# Patient Record
Sex: Male | Born: 1935 | Race: Black or African American | Hispanic: No | Smoking: Former smoker
Health system: Southern US, Community
[De-identification: ages and names within clinical notes are randomized; demographics above are authoritative.]

## PROBLEM LIST (undated history)

## (undated) DIAGNOSIS — H269 Unspecified cataract: Secondary | ICD-10-CM

## (undated) DIAGNOSIS — M1990 Unspecified osteoarthritis, unspecified site: Secondary | ICD-10-CM

## (undated) DIAGNOSIS — E785 Hyperlipidemia, unspecified: Secondary | ICD-10-CM

## (undated) DIAGNOSIS — E119 Type 2 diabetes mellitus without complications: Secondary | ICD-10-CM

## (undated) DIAGNOSIS — K219 Gastro-esophageal reflux disease without esophagitis: Secondary | ICD-10-CM

## (undated) DIAGNOSIS — G4733 Obstructive sleep apnea (adult) (pediatric): Secondary | ICD-10-CM

## (undated) DIAGNOSIS — G459 Transient cerebral ischemic attack, unspecified: Secondary | ICD-10-CM

## (undated) DIAGNOSIS — I4892 Unspecified atrial flutter: Secondary | ICD-10-CM

## (undated) DIAGNOSIS — J449 Chronic obstructive pulmonary disease, unspecified: Secondary | ICD-10-CM

## (undated) DIAGNOSIS — R972 Elevated prostate specific antigen [PSA]: Secondary | ICD-10-CM

## (undated) DIAGNOSIS — F419 Anxiety disorder, unspecified: Secondary | ICD-10-CM

## (undated) DIAGNOSIS — I10 Essential (primary) hypertension: Secondary | ICD-10-CM

## (undated) HISTORY — PX: PROSTATE BIOPSY: SHX241

## (undated) HISTORY — PX: KNEE ARTHROPLASTY: SHX992

---

## 2014-02-04 ENCOUNTER — Emergency Department (HOSPITAL_COMMUNITY)
Admission: EM | Admit: 2014-02-04 | Discharge: 2014-02-04 | Disposition: A | Discharge status: Home / Self Care | Payer: Medicare Other | Attending: Emergency Medicine | Admitting: Emergency Medicine

## 2014-02-04 ENCOUNTER — Encounter (HOSPITAL_COMMUNITY): Payer: Self-pay | Admitting: Emergency Medicine

## 2014-02-04 DIAGNOSIS — Z87891 Personal history of nicotine dependence: Secondary | ICD-10-CM | POA: Diagnosis not present

## 2014-02-04 DIAGNOSIS — F419 Anxiety disorder, unspecified: Secondary | ICD-10-CM | POA: Diagnosis not present

## 2014-02-04 DIAGNOSIS — Z79899 Other long term (current) drug therapy: Secondary | ICD-10-CM | POA: Insufficient documentation

## 2014-02-04 HISTORY — DX: Anxiety disorder, unspecified: F41.9

## 2014-02-04 MED ORDER — CLONAZEPAM 1 MG PO TABS: 0.5000 mg | ORAL_TABLET | Freq: Two times a day (BID) | ORAL | Status: AC

## 2014-02-04 NOTE — ED Notes (Addendum)
Pt reports hx of anxiety, was taking clonazepam but they stopped giving him prescription for it. Pt stopped taking it around July and having increase in anxiety. Was then prescribed buspirone but reports it doesn't help. Feels irritable and anxious about things, such as "his shoe laces being too tight." no acute distress noted at triage.

## 2014-02-04 NOTE — Discharge Instructions (Signed)

## 2014-02-04 NOTE — ED Provider Notes (Signed)
CSN: [id number]     Arrival date & time 02/04/14  1746 History   First MD Initiated Contact with Patient 02/04/14 1932     Chief Complaint  Patient presents with  . Anxiety     (Consider location/radiation/quality/duration/timing/severity/associated sxs/prior Treatment) HPI  Willie Harris is a(n) 78 y.o. male who presents to the ED with cc of anxiety. He has been out of his clonazepam for the past 2 weeks. He has been having frequent anxiety attacks. He was taken off  Klonopin because he tested positive for marijuana. The patient c/o irritability, anxiety. He denies any cp, sob, tremulousness, or seizure activity. He has been off of his meds for 2 weeks.  Past Medical History  Diagnosis Date  . Anxiety    History reviewed. No pertinent past surgical history. History reviewed. No pertinent family history. History  Substance Use Topics  . Smoking status: Former Games developermoker  . Smokeless tobacco: Not on file  . Alcohol Use: Yes     Comment: occ    Review of Systems  Ten systems reviewed and are negative for acute change, except as noted in the HPI.    Allergies  Sulfa antibiotics  Home Medications   Prior to Admission medications   Medication Sig Start Date End Date Taking? Authorizing Provider  hydrOXYzine (ATARAX/VISTARIL) 10 MG tablet Take 10 mg by mouth at bedtime as needed for anxiety (for sleep).   Yes Historical Provider, MD  clonazePAM (KLONOPIN) 0.5 MG tablet Take 0.5 mg by mouth 2 (two) times daily.    Historical Provider, MD   BP 160/90  Pulse 60  Temp(Src) 97.7 F (36.5 C) (Oral)  Resp 18  SpO2 96% Physical Exam Physical Exam  Nursing note and vitals reviewed. Constitutional: He appears well-developed and well-nourished. No distress.  HENT:  Head: Normocephalic and atraumatic.  Eyes: Conjunctivae normal are normal. No scleral icterus.  Neck: Normal range of motion. Neck supple.  Cardiovascular: Normal rate, regular rhythm and normal heart sounds.    Pulmonary/Chest: Effort normal and breath sounds normal. No respiratory distress.  Abdominal: Soft. There is no tenderness.  Musculoskeletal: He exhibits no edema.  Neurological: He is alert.  Skin: Skin is warm and dry. He is not diaphoretic.  Psychiatric: His behavior is normal.    ED Course  Procedures (including critical care time) Labs Review Labs Reviewed - No data to display  Imaging Review No results found.   EKG Interpretation None      MDM   Final diagnoses:  Anxiety    Pleasant make in NAD. I discussed ED policy with the patient regarding long term management of medication. I will give the patient a fes days worth of medication.  Patient is to follow up wit his prescribing physician.    Arthor CaptainAbigail Lashanna Angelo, PA-C 02/07/14 [phone]

## 2014-02-06 ENCOUNTER — Encounter (HOSPITAL_COMMUNITY): Payer: Self-pay | Admitting: Emergency Medicine

## 2014-02-06 ENCOUNTER — Emergency Department (HOSPITAL_COMMUNITY): Payer: Medicare Other

## 2014-02-06 ENCOUNTER — Emergency Department (HOSPITAL_COMMUNITY)
Admission: EM | Admit: 2014-02-06 | Discharge: 2014-02-06 | Disposition: A | Discharge status: Home / Self Care | Payer: Medicare Other | Attending: Emergency Medicine | Admitting: Emergency Medicine

## 2014-02-06 DIAGNOSIS — Z87891 Personal history of nicotine dependence: Secondary | ICD-10-CM | POA: Insufficient documentation

## 2014-02-06 DIAGNOSIS — F419 Anxiety disorder, unspecified: Secondary | ICD-10-CM | POA: Insufficient documentation

## 2014-02-06 DIAGNOSIS — Z79899 Other long term (current) drug therapy: Secondary | ICD-10-CM | POA: Insufficient documentation

## 2014-02-06 DIAGNOSIS — H539 Unspecified visual disturbance: Secondary | ICD-10-CM | POA: Diagnosis not present

## 2014-02-06 DIAGNOSIS — R269 Unspecified abnormalities of gait and mobility: Secondary | ICD-10-CM | POA: Diagnosis not present

## 2014-02-06 HISTORY — DX: Unspecified cataract: H26.9

## 2014-02-06 LAB — CBC WITH DIFFERENTIAL/PLATELET
BASOS ABS: 0 10*3/uL (ref 0.0–0.1)
Basophils Relative: 0 % (ref 0–1)
EOS PCT: 3 % (ref 0–5)
Eosinophils Absolute: 0.2 10*3/uL (ref 0.0–0.7)
HEMATOCRIT: 40.9 % (ref 39.0–52.0)
Hemoglobin: 14.2 g/dL (ref 13.0–17.0)
LYMPHS PCT: 36 % (ref 12–46)
Lymphs Abs: 2.3 10*3/uL (ref 0.7–4.0)
MCH: 30.1 pg (ref 26.0–34.0)
MCHC: 34.7 g/dL (ref 30.0–36.0)
MCV: 86.7 fL (ref 78.0–100.0)
MONO ABS: 0.6 10*3/uL (ref 0.1–1.0)
MONOS PCT: 9 % (ref 3–12)
Neutro Abs: 3.4 10*3/uL (ref 1.7–7.7)
Neutrophils Relative %: 52 % (ref 43–77)
Platelets: 188 10*3/uL (ref 150–400)
RBC: 4.72 MIL/uL (ref 4.22–5.81)
RDW: 13.7 % (ref 11.5–15.5)
WBC: 6.5 10*3/uL (ref 4.0–10.5)

## 2014-02-06 LAB — COMPREHENSIVE METABOLIC PANEL
ALT: 18 U/L (ref 0–53)
ANION GAP: 10 (ref 5–15)
AST: 19 U/L (ref 0–37)
Albumin: 3.4 g/dL — ABNORMAL LOW (ref 3.5–5.2)
Alkaline Phosphatase: 52 U/L (ref 39–117)
BUN: 13 mg/dL (ref 6–23)
CALCIUM: 9.2 mg/dL (ref 8.4–10.5)
CO2: 25 meq/L (ref 19–32)
CREATININE: 0.99 mg/dL (ref 0.50–1.35)
Chloride: 105 mEq/L (ref 96–112)
GFR calc Af Amer: >90 mL/min (ref >90)
GFR, EST NON AFRICAN AMERICAN: 78 mL/min — AB (ref >90)
Glucose, Bld: 104 mg/dL — ABNORMAL HIGH (ref 70–99)
Potassium: 4.5 mEq/L (ref 3.7–5.3)
Sodium: 140 mEq/L (ref 137–147)
Total Bilirubin: 0.7 mg/dL (ref 0.3–1.2)
Total Protein: 7 g/dL (ref 6.0–8.3)

## 2014-02-06 LAB — ETHANOL

## 2014-02-06 NOTE — ED Notes (Signed)
Neuro MD at bedside

## 2014-02-06 NOTE — Discharge Instructions (Signed)
1. See your PCP in 1 week 2. Follow up with neurology referral in 2 weeks 3. Come back if worsening symptoms Fall Prevention and Home Safety Falls cause injuries and can affect all age groups. It is possible to use preventive measures to significantly decrease the likelihood of falls. There are many simple measures which can make your home safer and prevent falls. OUTDOORS  Repair cracks and edges of walkways and driveways.  Remove high doorway thresholds.  Trim shrubbery on the main path into your home.  Have good outside lighting.  Clear walkways of tools, rocks, debris, and clutter.  Check that handrails are not broken and are securely fastened. Both sides of steps should have handrails.  Have leaves, snow, and ice cleared regularly.  Use sand or salt on walkways during winter months.  In the garage, clean up grease or oil spills. BATHROOM  Install night lights.  Install grab bars by the toilet and in the tub and shower.  Use non-skid mats or decals in the tub or shower.  Place a plastic non-slip stool in the shower to sit on, if needed.  Keep floors dry and clean up all water on the floor immediately.  Remove soap buildup in the tub or shower on a regular basis.  Secure bath mats with non-slip, double-sided rug tape.  Remove throw rugs and tripping hazards from the floors. BEDROOMS  Install night lights.  Make sure a bedside light is easy to reach.  Do not use oversized bedding.  Keep a telephone by your bedside.  Have a firm chair with side arms to use for getting dressed.  Remove throw rugs and tripping hazards from the floor. KITCHEN  Keep handles on pots and pans turned toward the center of the stove. Use back burners when possible.  Clean up spills quickly and allow time for drying.  Avoid walking on wet floors.  Avoid hot utensils and knives.  Position shelves so they are not too high or low.  Place commonly used objects within easy  reach.  If necessary, use a sturdy step stool with a grab bar when reaching.  Keep electrical cables out of the way.  Do not use floor polish or wax that makes floors slippery. If you must use wax, use non-skid floor wax.  Remove throw rugs and tripping hazards from the floor. STAIRWAYS  Never leave objects on stairs.  Place handrails on both sides of stairways and use them. Fix any loose handrails. Make sure handrails on both sides of the stairways are as long as the stairs.  Check carpeting to make sure it is firmly attached along stairs. Make repairs to worn or loose carpet promptly.  Avoid placing throw rugs at the top or bottom of stairways, or properly secure the rug with carpet tape to prevent slippage. Get rid of throw rugs, if possible.  Have an electrician put in a light switch at the top and bottom of the stairs. OTHER FALL PREVENTION TIPS  Wear low-heel or rubber-soled shoes that are supportive and fit well. Wear closed toe shoes.  When using a stepladder, make sure it is fully opened and both spreaders are firmly locked. Do not climb a closed stepladder.  Add color or contrast paint or tape to grab bars and handrails in your home. Place contrasting color strips on first and last steps.  Learn and use mobility aids as needed. Install an electrical emergency response system.  Turn on lights to avoid dark areas. Replace light bulbs  that burn out immediately. Get light switches that glow.  Arrange furniture to create clear pathways. Keep furniture in the same place.  Firmly attach carpet with non-skid or double-sided tape.  Eliminate uneven floor surfaces.  Select a carpet pattern that does not visually hide the edge of steps.  Be aware of all pets. OTHER HOME SAFETY TIPS  Set the water temperature for 120 F (48.8 C).  Keep emergency numbers on or near the telephone.  Keep smoke detectors on every level of the home and near sleeping areas. Document Released:  03/24/2002 Document Revised: 10/03/2011 Document Reviewed: 06/23/2011 [facility] Patient Information 2015 [place]. This information is not intended to replace advice given to you by your health care provider. Make sure you discuss any questions you have with your health care provider.

## 2014-02-06 NOTE — ED Provider Notes (Signed)
CSN: [id number]     Arrival date & time 02/06/14  1107 History   First MD Initiated Contact with Patient 02/06/14 1114     Chief Complaint  Patient presents with  . Gait Problem     (Consider location/radiation/quality/duration/timing/severity/associated sxs/prior Treatment) Patient is a 78 y.o. male presenting with near-syncope. The history is provided by the patient. No language interpreter was used.  Near Syncope This is a new problem. The current episode started today. The problem occurs constantly. The problem has been gradually improving. Pertinent negatives include no abdominal pain, chest pain, congestion, coughing, fever, headaches, nausea, numbness, rash, sore throat or vomiting. The symptoms are aggravated by walking. He has tried nothing for the symptoms. The treatment provided no relief.    Past Medical History  Diagnosis Date  . Anxiety    No past surgical history on file. No family history on file. History  Substance Use Topics  . Smoking status: Former Games developer  . Smokeless tobacco: Not on file  . Alcohol Use: Yes     Comment: occ    Review of Systems  Constitutional: Negative for fever.  HENT: Negative for congestion, rhinorrhea and sore throat.   Eyes: Positive for visual disturbance.  Respiratory: Negative for cough and shortness of breath.   Cardiovascular: Positive for near-syncope. Negative for chest pain.  Gastrointestinal: Negative for nausea, vomiting, abdominal pain and diarrhea.  Genitourinary: Negative for dysuria and hematuria.  Musculoskeletal: Positive for gait problem.  Skin: Negative for rash.  Neurological: Positive for light-headedness. Negative for syncope, numbness and headaches.  All other systems reviewed and are negative.     Allergies  Sulfa antibiotics  Home Medications   Prior to Admission medications   Medication Sig Start Date End Date Taking? Authorizing Provider  clonazePAM (KLONOPIN) 1 MG tablet Take 0.5 tablets (0.5  mg total) by mouth 2 (two) times daily. 02/04/14   Arthor Captain, PA-C  hydrOXYzine (ATARAX/VISTARIL) 10 MG tablet Take 10 mg by mouth at bedtime as needed for anxiety (for sleep).    Historical Provider, MD   BP 128/99  Temp(Src) 97.3 F (36.3 C) (Oral)  Resp 18  SpO2 98% Physical Exam  Nursing note and vitals reviewed. Constitutional: He is oriented to person, place, and time. He appears well-developed and well-nourished.  HENT:  Head: Normocephalic and atraumatic.  Right Ear: External ear normal.  Left Ear: External ear normal.  Eyes: EOM are normal.  Neck: Normal range of motion. Neck supple.  Cardiovascular: Normal rate, regular rhythm and intact distal pulses.  Exam reveals no gallop and no friction rub.   No murmur heard. Pulmonary/Chest: Effort normal and breath sounds normal. No respiratory distress. He has no wheezes. He has no rales. He exhibits no tenderness.  Abdominal: Soft. Bowel sounds are normal. He exhibits no distension. There is no tenderness. There is no rebound.  Musculoskeletal: Normal range of motion. He exhibits no edema and no tenderness.  Lymphadenopathy:    He has no cervical adenopathy.  Neurological: He is alert and oriented to person, place, and time. No cranial nerve deficit. He exhibits normal muscle tone. Coordination normal.  Neurologic exam: CN I-XII: grossly intact, Sensation: normal in upper and lower extremities, Strength 5/5 in both upper and lower extremities, Coordination intact. Gait: antalgic  Skin: Skin is warm. No rash noted.  Psychiatric: He has a normal mood and affect. His behavior is normal.    ED Course  Procedures (including critical care time) Labs Review Labs Reviewed  COMPREHENSIVE METABOLIC  PANEL - Abnormal; Notable for the following:    Glucose, Bld 104 (*)    Albumin 3.4 (*)    GFR calc non Af Amer 78 (*)    All other components within normal limits  CBC WITH DIFFERENTIAL  ETHANOL  URINE RAPID DRUG SCREEN (HOSP  PERFORMED)  URINALYSIS, ROUTINE W REFLEX MICROSCOPIC    Imaging Review Mr Brain Wo Contrast  02/06/2014   CLINICAL DATA:  Gait abnormality.  Dizziness and blurred vision  EXAM: MRI HEAD WITHOUT CONTRAST  TECHNIQUE: Multiplanar, multiecho pulse sequences of the brain and surrounding structures were obtained without intravenous contrast.  COMPARISON:  None.  FINDINGS: Negative for acute infarct.  Mild atrophy. Mild chronic microvascular ischemic change in the white matter and pons. 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 No cortical infarct  Negative for intracranial hemorrhage. No mass or edema. No shift of the midline structures.  Mild mucosal edema paranasal sinuses. Vessels at the base of the brain are patent.  IMPRESSION: Negative for acute infarct  Mild atrophy and chronic microvascular ischemic change.   Electronically Signed   By: Marlan Palauharles  Clark M.D.   On: 02/06/2014 13:28     EKG Interpretation None      MDM   Final diagnoses:  None    11:15 AM Pt is a 78 y.o. male with pertinent PMHX of anxiety, HTN, TIA 4 years ago who presents to the ED with gait disturbance, lightheadedness, blurry vision and elevated BP. Awoke this morning around 9AM with blurry vision, gait disturbance. No loss of hearing, endorses lightheadedness. [facility]ed [facility]: BP was 150/80. Per pharmacy and EMS issues with visual fields. No chest pain or shortness of breath. No hearing or vision loss. No recent illness. No urinary symptoms. Denies any cocaine or alcohol.   On exam: well appearing, non focal neurologic exam with intact coordination. Upon walking patient, patient with abnormal gait. Shuffling right and left. Attempted to narrow gait with worsening gait disturbance. Concern for possible stroke versus posterior stroke given history of TIA. Plan for MRI brain wo contrast after discussion with neurology (does not meet window for TPA or thrombectomy): CBc, CMP, ETOH,  EKG  personally reviewed by myself showed NSR Rate of 50, PR 171ms, QRS 93ms QT/QTC 447/44808ms, normal axis, without evidence of new ischemia. No Comparison, indication: gait disturbance  Review of labs: CBc: no leukocytosis, H&H 14.2/40.9 CMP: no electrolyte abnormalities, no elevated LFTs ETOH: <11  MRI brain wo contrast no evidence of stroke. Reviewed with neurologist. Patient without evidence of stroke. Plan for discharge with close follow up with neurology outpatient and PCP as needed. Patient given strict return precautions  2:30 PM:  I have discussed the diagnosis/risks/treatment options with the patient and believe the pt to be eligible for discharge home to follow-up with PCP and neurology referral. We also discussed returning to the ED immediately if new or worsening sx occur. We discussed the sx which are most concerning (e.g., worsening symptoms) that necessitate immediate return. Any new prescriptions provided to the patient are listed below.   New Prescriptions   No medications on file    The patient appears reasonably screened and/or stabilized for discharge and I doubt any other medical condition or other [facility] requiring further screening, evaluation or treatment in the ED at this time prior to discharge . Pt in agreement with discharge plan. Return precautions given. Pt discharged VSS   Labs, EKG and imaging reviewed by myself and considered in  medical decision making if ordered.  Imaging interpreted by radiology. Pt was discussed with my attending, Dr. Doristine MangoBeaton     Kimoni Pickerill Peter Navarro Nine, MD 02/06/14 [phone]

## 2014-02-06 NOTE — ED Notes (Addendum)
Pt reports waking up at 9 and feeling unsteady as he stood up. Reports dizziness and blurred vision while walking. No vision loss or falls reported. Pt denies NV or headache. Pt reports no changes in vision or gait since initial recognition this morning. Pt also reports a Hx of dizziness when standing in the mornings x1 month.

## 2014-02-06 NOTE — Consult Note (Signed)
Referring Physician: ED    Chief Complaint: ataxia, dizziness, blurred vision  HPI:                                                                                                                                         Willie Harris is an 78 y.o. male with a past medical history significant for anxiety, panic attacks, TIA few years ago, comes in for further evaluation of the above stated symptoms. Wife is at the bedside. Patient stated that he usually gets dizzy and off balance in the morning, but when he woke up this morning around 9 am " the imbalance and dizziness was worse than ever". Did not fall and had associated lightheadedness and blurred vision. No associated HA, double vision, focal weakness or numbness, slurred speech, or language impairment. NIHSS 0 in the ED. MRI brain showed no acute intracranial abnormality or other lesions to explain his symptoms.  ETOH<11. UDS pending. Date last known well: 02/05/14 Time last known well: uncertain tPA Given: no, late presentation, symptoms virtually resolved. NIHSS: 0   Past Medical History  Diagnosis Date  . Anxiety   . Cataract     bilateral    History reviewed. No pertinent past surgical history.  History reviewed. No pertinent family history. Social History:  reports that he quit smoking about 8 months ago. He does not have any smokeless tobacco history on file. He reports that he drinks alcohol. He reports that he uses illicit drugs (Marijuana).  Allergies:  Allergies  Allergen Reactions  . Sulfa Antibiotics Other (See Comments)    Causing skin on hand to get real tight    Medications:                                                                                                                           I have reviewed the patient's current medications.  ROS:  History obtained from the  patient and chart review  General ROS: negative for - chills, fatigue, fever, night sweats, weight gain or weight loss Psychological ROS: negative for - behavioral disorder, hallucinations, mood swings or suicidal ideation Significant for some forgetfulness.  Ophthalmic ROS: negative for - blurry vision, double vision, eye pain or loss of vision ENT ROS: negative for - epistaxis, nasal discharge, oral lesions, sore throat, tinnitus or vertigo Allergy and Immunology ROS: negative for - hives or itchy/watery eyes Hematological and Lymphatic ROS: negative for - bleeding problems, bruising or swollen lymph nodes Endocrine ROS: negative for - galactorrhea, hair pattern changes, polydipsia/polyuria or temperature intolerance Respiratory ROS: negative for - cough, hemoptysis, shortness of breath or wheezing Cardiovascular ROS: negative for - chest pain, dyspnea on exertion, edema or irregular heartbeat Gastrointestinal ROS: negative for - abdominal pain, diarrhea, hematemesis, nausea/vomiting or stool incontinence Genito-Urinary ROS: negative for - dysuria, hematuria, incontinence or urinary frequency/urgency Musculoskeletal ROS: negative for - joint swelling or muscular weakness Neurological ROS: as noted in HPI Dermatological ROS: negative for rash and skin lesion changes  Physical exam: pleasant male in no apparent distress. Blood pressure 151/114, pulse 47, temperature 97.3 F (36.3 C), temperature source Oral, resp. rate 17, height 5' 11"  (1.803 m), weight 81.647 kg (180 lb), SpO2 92.00%. Head: normocephalic. Neck: supple, no bruits, no JVD. Cardiac: no murmurs. Lungs: clear. Abdomen: soft, no tender, no mass. Extremities: no edema. Neurologic Examination:                                                                                                      General: Mental Status: Alert, oriented, thought content appropriate.  Speech fluent without evidence of aphasia.  Able to follow 3 step  commands without difficulty. Cranial Nerves: II: Discs flat bilaterally; Visual fields grossly normal, pupils equal, round, reactive to light and accommodation III,IV, VI: ptosis not present, extra-ocular motions intact bilaterally V,VII: smile symmetric, facial light touch sensation normal bilaterally VIII: hearing normal bilaterally IX,X: gag reflex present XI: bilateral shoulder shrug XII: midline tongue extension without atrophy or fasciculations Motor: Right : Upper extremity   5/5    Left:     Upper extremity   5/5  Lower extremity   5/5     Lower extremity   5/5 Tone and bulk:normal tone throughout; no atrophy noted Sensory: Pinprick and light touch intact throughout, bilaterally Deep Tendon Reflexes:  Right: Upper Extremity   Left: Upper extremity   biceps (C-5 to C-6) 2/4   biceps (C-5 to C-6) 2/4 tricep (C7) 2/4    triceps (C7) 2/4 Brachioradialis (C6) 2/4  Brachioradialis (C6) 2/4  Lower Extremity Lower Extremity  quadriceps (L-2 to L-4) 2/4   quadriceps (L-2 to L-4) 2/4 Achilles (S1) 2/4   Achilles (S1) 2/4  Plantars: Right: downgoing   Left: downgoing Cerebellar: normal finger-to-nose,  normal heel-to-shin test Gait:  No ataxia.    Results for orders placed during the hospital encounter of 02/06/14 (from the past 48 hour(s))  CBC WITH DIFFERENTIAL     Status: None   Collection Time  02/06/14 12:04 PM      Result Value Ref Range   WBC 6.5  4.0 - 10.5 K/uL   RBC 4.72  4.22 - 5.81 MIL/uL   Hemoglobin 14.2  13.0 - 17.0 g/dL   HCT 40.9  39.0 - 52.0 %   MCV 86.7  78.0 - 100.0 fL   MCH 30.1  26.0 - 34.0 pg   MCHC 34.7  30.0 - 36.0 g/dL   RDW 13.7  11.5 - 15.5 %   Platelets 188  150 - 400 K/uL   Neutrophils Relative % 52  43 - 77 %   Neutro Abs 3.4  1.7 - 7.7 K/uL   Lymphocytes Relative 36  12 - 46 %   Lymphs Abs 2.3  0.7 - 4.0 K/uL   Monocytes Relative 9  3 - 12 %   Monocytes Absolute 0.6  0.1 - 1.0 K/uL   Eosinophils Relative 3  0 - 5 %   Eosinophils  Absolute 0.2  0.0 - 0.7 K/uL   Basophils Relative 0  0 - 1 %   Basophils Absolute 0.0  0.0 - 0.1 K/uL  COMPREHENSIVE METABOLIC PANEL     Status: Abnormal   Collection Time    02/06/14 12:04 PM      Result Value Ref Range   Sodium 140  137 - 147 mEq/L   Potassium 4.5  3.7 - 5.3 mEq/L   Chloride 105  96 - 112 mEq/L   CO2 25  19 - 32 mEq/L   Glucose, Bld 104 (*) 70 - 99 mg/dL   BUN 13  6 - 23 mg/dL   Creatinine, Ser 0.99  0.50 - 1.35 mg/dL   Calcium 9.2  8.4 - 10.5 mg/dL   Total Protein 7.0  6.0 - 8.3 g/dL   Albumin 3.4 (*) 3.5 - 5.2 g/dL   AST 19  0 - 37 U/L   ALT 18  0 - 53 U/L   Alkaline Phosphatase 52  39 - 117 U/L   Total Bilirubin 0.7  0.3 - 1.2 mg/dL   GFR calc non Af Amer 78 (*) >90 mL/min   GFR calc Af Amer >90  >90 mL/min   Comment: (NOTE)     The eGFR has been calculated using the CKD EPI equation.     This calculation has not been validated in all clinical situations.     eGFR's persistently <90 mL/min signify possible Chronic Kidney     Disease.   Anion gap 10  5 - 15  ETHANOL     Status: None   Collection Time    02/06/14 12:04 PM      Result Value Ref Range   Alcohol, Ethyl (B) <11  0 - 11 mg/dL   Comment:            LOWEST DETECTABLE LIMIT FOR     SERUM ALCOHOL IS 11 mg/dL     FOR MEDICAL PURPOSES ONLY   Mr Brain Wo Contrast  02/06/2014   CLINICAL DATA:  Gait abnormality.  Dizziness and blurred vision  EXAM: MRI HEAD WITHOUT CONTRAST  TECHNIQUE: Multiplanar, multiecho pulse sequences of the brain and surrounding structures were obtained without intravenous contrast.  COMPARISON:  None.  FINDINGS: Negative for acute infarct.  Mild atrophy. Mild chronic microvascular ischemic change in the white matter and pons. 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 No cortical infarct  Negative for intracranial hemorrhage. No mass or edema. No shift  of the midline structures.  Mild mucosal edema paranasal sinuses. Vessels at the base of the  brain are patent.  IMPRESSION: Negative for acute infarct  Mild atrophy and chronic microvascular ischemic change.   Electronically Signed   By: Franchot Gallo M.D.   On: 02/06/2014 13:28    Assessment: 78 y.o. male with chronic unsteadiness and lightheadedness that reportedly got worse today. Neuro-exam non focal. MRI brain unremarkable. Patient with history of anxiety, panic attacks, started in clonazepam just recently. Uncertain cause of chronic worsening unsteadiness/dizziness but does not seem to be related to cerebrovascular involvement posterior circulation at this time. I think he needs further neuro outpatient investigations, but is able to go home today.  Dorian Pod ,MD [facility] [phone]  02/06/2014, 2:42 PM

## 2014-02-06 NOTE — ED Provider Notes (Signed)
I saw and evaluated the patient, reviewed the resident's note and I agree with the findings and plan.   .Face to face Exam:  General:  Awake HEENT:  Atraumatic Resp:  Normal effort Abd:  Nondistended Neuro:No focal weakness   EKG discussed and reviewed with resident  Nelia Shiobert L Aja Bolander, MD 02/06/14 2023

## 2014-02-08 NOTE — ED Provider Notes (Signed)
Medical screening examination/treatment/procedure(s) were performed by non-physician practitioner and as supervising physician I was immediately available for consultation/collaboration.   EKG Interpretation None        Enid SkeensJoshua M Indya Oliveria, MD 02/08/14 [phone]

## 2014-03-26 ENCOUNTER — Other Ambulatory Visit (HOSPITAL_COMMUNITY): Payer: Self-pay | Admitting: Internal Medicine

## 2014-03-26 DIAGNOSIS — R001 Bradycardia, unspecified: Secondary | ICD-10-CM

## 2014-03-27 ENCOUNTER — Ambulatory Visit (HOSPITAL_COMMUNITY): Admission: RE | Admit: 2014-03-27 | Payer: Medicare Other | Source: Ambulatory Visit

## 2014-07-11 ENCOUNTER — Emergency Department (INDEPENDENT_AMBULATORY_CARE_PROVIDER_SITE_OTHER)
Admission: EM | Admit: 2014-07-11 | Discharge: 2014-07-11 | Disposition: A | Discharge status: Home / Self Care | Payer: Medicare Other | Source: Home / Self Care | Attending: Family Medicine | Admitting: Family Medicine

## 2014-07-11 ENCOUNTER — Encounter (HOSPITAL_COMMUNITY): Payer: Self-pay | Admitting: *Deleted

## 2014-07-11 DIAGNOSIS — R5382 Chronic fatigue, unspecified: Secondary | ICD-10-CM | POA: Diagnosis not present

## 2014-07-11 LAB — POCT I-STAT, CHEM 8
BUN: 15 mg/dL (ref 6–23)
CREATININE: 1.2 mg/dL (ref 0.50–1.35)
Calcium, Ion: 1.21 mmol/L (ref 1.13–1.30)
Chloride: 105 mmol/L (ref 96–112)
GLUCOSE: 83 mg/dL (ref 70–99)
HEMATOCRIT: 51 % (ref 39.0–52.0)
Hemoglobin: 17.3 g/dL — ABNORMAL HIGH (ref 13.0–17.0)
Potassium: 3.9 mmol/L (ref 3.5–5.1)
Sodium: 141 mmol/L (ref 135–145)
TCO2: 22 mmol/L (ref 0–100)

## 2014-07-11 NOTE — Discharge Instructions (Signed)
See your doctor when possible for further eval of symptoms

## 2014-07-11 NOTE — ED Provider Notes (Signed)
CSN: [id number]     Arrival date & time 07/11/14  1318 History   First MD Initiated Contact with Patient 07/11/14 1328     Chief Complaint  Patient presents with  . Weakness   (Consider location/radiation/quality/duration/timing/severity/associated sxs/prior Treatment) Patient is a 79 y.o. male presenting with weakness.  Weakness This is a chronic problem. The current episode started more than 1 week ago (went to ER last eve but left due to so many pt, no pain in chest or abd). The problem has been gradually worsening. Pertinent negatives include no chest pain and no abdominal pain.    Past Medical History  Diagnosis Date  . Anxiety   . Cataract     bilateral   No past surgical history on file. No family history on file. History  Substance Use Topics  . Smoking status: Former Smoker    Quit date: 05/09/2013  . Smokeless tobacco: Not on file  . Alcohol Use: Yes     Comment: occ    Review of Systems  Constitutional: Positive for fatigue.  HENT: Negative.   Eyes: Positive for visual disturbance.  Respiratory: Negative.   Cardiovascular: Negative for chest pain.  Gastrointestinal: Negative.  Negative for abdominal pain.  Genitourinary: Negative.   Neurological: Positive for dizziness and weakness.    Allergies  Sulfa antibiotics  Home Medications   Prior to Admission medications   Medication Sig Start Date End Date Taking? Authorizing Provider  clonazePAM (KLONOPIN) 1 MG tablet Take 0.5 tablets (0.5 mg total) by mouth 2 (two) times daily. 02/04/14   Arthor CaptainAbigail Harris, PA-C  hydrOXYzine (ATARAX/VISTARIL) 10 MG tablet Take 10 mg by mouth at bedtime as needed for anxiety (for sleep).    Historical Provider, MD   BP 164/92 mmHg  Pulse 55  Temp(Src) 98.2 F (36.8 C) (Oral)  Resp 16  SpO2 97% Physical Exam  Constitutional: He is oriented to person, place, and time. He appears well-developed and well-nourished. No distress.  Eyes: Pupils are equal, round, and reactive to  light.  Neck: Normal range of motion. Neck supple.  Cardiovascular: Normal rate and normal heart sounds.   Pulmonary/Chest: Effort normal and breath sounds normal.  Abdominal: There is no tenderness.  Lymphadenopathy:    He has no cervical adenopathy.  Neurological: He is alert and oriented to person, place, and time.  Skin: Skin is warm and dry.  Nursing note and vitals reviewed.   ED Course  Procedures (including critical care time) Labs Review Labs Reviewed  POCT I-STAT, CHEM 8 - Abnormal; Notable for the following:    Hemoglobin 17.3 (*)    All other components within normal limits   i-stat wnl, bs 83. Imaging Review No results found.   MDM   1. Chronic fatigue        Linna HoffJames D Anis Degidio, MD 07/11/14 1350

## 2014-07-11 NOTE — ED Notes (Signed)
Pt  Is  A  Somewhat  Vague historian  But    Reports      Symptoms  Of  Being  Weak  And  Shakey        He     Reports  Some  Blurred  Vision  As  Well he  denys  Any  Chest pain or  Any  Shortness  Of  Breath     His  Skin is  Warm and  Dry

## 2014-10-09 DIAGNOSIS — I498 Other specified cardiac arrhythmias: Secondary | ICD-10-CM | POA: Diagnosis not present

## 2014-10-09 DIAGNOSIS — F419 Anxiety disorder, unspecified: Secondary | ICD-10-CM | POA: Diagnosis not present

## 2014-10-09 DIAGNOSIS — M179 Osteoarthritis of knee, unspecified: Secondary | ICD-10-CM | POA: Diagnosis not present

## 2014-10-09 DIAGNOSIS — Z131 Encounter for screening for diabetes mellitus: Secondary | ICD-10-CM | POA: Diagnosis not present

## 2014-10-09 DIAGNOSIS — E784 Other hyperlipidemia: Secondary | ICD-10-CM | POA: Diagnosis not present

## 2014-11-18 DIAGNOSIS — R7301 Impaired fasting glucose: Secondary | ICD-10-CM | POA: Diagnosis not present

## 2014-11-18 DIAGNOSIS — M179 Osteoarthritis of knee, unspecified: Secondary | ICD-10-CM | POA: Diagnosis not present

## 2014-11-18 DIAGNOSIS — F419 Anxiety disorder, unspecified: Secondary | ICD-10-CM | POA: Diagnosis not present

## 2014-11-18 DIAGNOSIS — E784 Other hyperlipidemia: Secondary | ICD-10-CM | POA: Diagnosis not present

## 2015-02-11 DIAGNOSIS — H2511 Age-related nuclear cataract, right eye: Secondary | ICD-10-CM | POA: Diagnosis not present

## 2015-02-17 DIAGNOSIS — E784 Other hyperlipidemia: Secondary | ICD-10-CM | POA: Diagnosis not present

## 2015-02-17 DIAGNOSIS — M179 Osteoarthritis of knee, unspecified: Secondary | ICD-10-CM | POA: Diagnosis not present

## 2015-02-17 DIAGNOSIS — R7301 Impaired fasting glucose: Secondary | ICD-10-CM | POA: Diagnosis not present

## 2015-03-25 DIAGNOSIS — H2512 Age-related nuclear cataract, left eye: Secondary | ICD-10-CM | POA: Diagnosis not present

## 2017-01-12 ENCOUNTER — Ambulatory Visit (HOSPITAL_COMMUNITY)
Admission: EM | Admit: 2017-01-12 | Discharge: 2017-01-12 | Disposition: A | Discharge status: Home / Self Care | Payer: Medicare Other | Attending: Internal Medicine | Admitting: Internal Medicine

## 2017-01-12 ENCOUNTER — Encounter (HOSPITAL_COMMUNITY): Payer: Self-pay | Admitting: Emergency Medicine

## 2017-01-12 DIAGNOSIS — R0602 Shortness of breath: Secondary | ICD-10-CM

## 2017-01-12 DIAGNOSIS — F411 Generalized anxiety disorder: Secondary | ICD-10-CM | POA: Diagnosis not present

## 2017-01-12 MED ORDER — DRONABINOL 5 MG PO CAPS: 5.0000 mg | ORAL_CAPSULE | Freq: Every day | ORAL | 1 refills | Status: DC

## 2017-01-12 NOTE — ED Triage Notes (Signed)
Pt here for intermittent dizziness onset 2 months +++ associated w/weakness  A&O x4... NAD.[facility]

## 2017-01-13 NOTE — ED Provider Notes (Addendum)
[facility]    CSN: [id number] Arrival date & time: 01/12/17  1507     History   Chief Complaint Chief Complaint  Patient presents with  . Dizziness    HPI Willie Harris is a 81 y.o. male.   Pt w/ PMH of COPD reports episodes of dizziness and "weakness" when he feels shaky and SOB. He uses his albuterol inhaler which briefly works well but leaves him feeling "funny". Denies chest pain or palpitations. He used to take clonazepam for these symptoms. [facility] because he had marijuana detected in his urine. He does not smoke marijuana anymore. He reports difficulty sleeping as well. Denies depression. He is a former smoker.       Past Medical History:  Diagnosis Date  . Anxiety   . Cataract    bilateral    There are no active problems to display for this patient.   History reviewed. No pertinent surgical history.     Home Medications    Prior to Admission medications   Medication Sig Start Date End Date Taking? Authorizing Provider  clonazePAM (KLONOPIN) 1 MG tablet Take 0.5 tablets (0.5 mg total) by mouth 2 (two) times daily. 02/04/14  Yes Harris, Abigail, PA-C  hydrOXYzine (ATARAX/VISTARIL) 10 MG tablet Take 10 mg by mouth at bedtime as needed for anxiety (for sleep).   Yes [provider]  dronabinol (MARINOL) 5 MG capsule Take 1 capsule (5 mg total) by mouth daily before lunch. 01/12/17   Arnaldo Natal, MD    Family History History reviewed. No pertinent family history.  Social History Social History  Substance Use Topics  . Smoking status: Former Smoker    Quit date: 05/09/2013  . Smokeless tobacco: Never Used  . Alcohol use Yes     Comment: occ     Allergies   Sulfa antibiotics   Review of Systems Review of Systems  Constitutional: Negative for chills and fever.  HENT: Negative for sore throat and tinnitus.   Eyes: Negative for redness.  Respiratory: Positive for  shortness of breath (intermittently). Negative for cough.   Cardiovascular: Negative for chest pain and palpitations.  Gastrointestinal: Negative for abdominal pain, diarrhea, nausea and vomiting.  Genitourinary: Negative for dysuria, frequency and urgency.  Musculoskeletal: Negative for myalgias.  Skin: Negative for rash.       No lesions  Neurological: Positive for dizziness and weakness.       See HPI  Hematological: Does not bruise/bleed easily.  Psychiatric/Behavioral: Negative for suicidal ideas.     Physical Exam Triage Vital Signs ED Triage Vitals [01/12/17 1605]  Enc Vitals Group     BP 131/68     Pulse Rate 73     Resp 20     Temp 98.1 F (36.7 C)     Temp Source Oral     SpO2 100 %     Weight      Height      Head Circumference      Peak Flow      Pain Score      Pain Loc      Pain Edu?      Excl. in GC?    No data found.   Updated Vital Signs BP 131/68 (BP Location: Left Arm)   Pulse 73   Temp 98.1 F (36.7 C) (Oral)   Resp 20   SpO2 100%   Visual Acuity Right Eye Distance:  Left Eye Distance:   Bilateral Distance:    Right Eye Near:   Left Eye Near:    Bilateral Near:     Physical Exam  Constitutional: He is oriented to person, place, and time. He appears well-developed and well-nourished. No distress.  HENT:  Head: Normocephalic and atraumatic.  Mouth/Throat: Oropharynx is clear and moist.  Eyes: Pupils are equal, round, and reactive to light. Conjunctivae and EOM are normal. No scleral icterus.  Neck: Normal range of motion. Neck supple. No JVD present. No tracheal deviation present. No thyromegaly present.  Cardiovascular: Normal rate, regular rhythm and normal heart sounds.  Exam reveals no gallop and no friction rub.   No murmur heard. Pulmonary/Chest: Effort normal and breath sounds normal. No respiratory distress.  Abdominal: Soft. Bowel sounds are normal. He exhibits no distension. There is no tenderness.  Musculoskeletal: Normal  range of motion. He exhibits no edema.  Lymphadenopathy:    He has no cervical adenopathy.  Neurological: He is alert and oriented to person, place, and time. No cranial nerve deficit.  Skin: Skin is warm and dry. No rash noted. No erythema.  Psychiatric: He has a normal mood and affect. His behavior is normal. Judgment and thought content normal.     UC Treatments / Results  Labs (all labs ordered are listed, but only abnormal results are displayed) Labs Reviewed - No data to display  EKG  EKG Interpretation None       Radiology No results found.  Procedures Procedures (including critical care time)  Medications Ordered in UC Medications - No data to display   Initial Impression / Assessment and Plan / UC Course  I have reviewed the triage vital signs and the nursing notes.  Pertinent labs & imaging results that were available during my care of the patient were reviewed by me and considered in my medical decision making (see chart for details).     Symptoms seem to be related to anxiety. Also has not been taking his Spiriva. Advised him to resume as this will improve functionality with COPD. Denies orthopnea, pedal edema or PND.   Final Clinical Impressions(s) / UC Diagnoses   Final diagnoses:  SOB (shortness of breath)  Generalized anxiety disorder    New Prescriptions Discharge Medication List as of 01/12/2017  5:23 PM    START taking these medications   Details  dronabinol (MARINOL) 5 MG capsule Take 1 capsule (5 mg total) by mouth daily before lunch., Starting Fri 01/12/2017, Print         Controlled Substance Prescriptions [place] Controlled Substance Registry consulted? Not Applicable   Arnaldo Natal, MD 01/13/17 1449    Arnaldo Natal, MD 01/13/17 1450    Arnaldo Natal, MD 01/13/17 [phone]

## 2017-11-19 ENCOUNTER — Ambulatory Visit (HOSPITAL_COMMUNITY)
Admission: EM | Admit: 2017-11-19 | Discharge: 2017-11-19 | Disposition: A | Discharge status: Home / Self Care | Payer: Medicare HMO | Attending: Family Medicine | Admitting: Family Medicine

## 2017-11-19 ENCOUNTER — Encounter (HOSPITAL_COMMUNITY): Payer: Self-pay | Admitting: Emergency Medicine

## 2017-11-19 DIAGNOSIS — F411 Generalized anxiety disorder: Secondary | ICD-10-CM | POA: Diagnosis not present

## 2017-11-19 DIAGNOSIS — R0602 Shortness of breath: Secondary | ICD-10-CM

## 2017-11-19 DIAGNOSIS — J449 Chronic obstructive pulmonary disease, unspecified: Secondary | ICD-10-CM

## 2017-11-19 HISTORY — DX: Chronic obstructive pulmonary disease, unspecified: J44.9

## 2017-11-19 NOTE — ED Provider Notes (Signed)
[facility]    CSN: [id number] Arrival date & time: 11/19/17  1820     History   Chief Complaint Chief Complaint  Patient presents with  . Shortness of Breath    HPI Willie Harris is a 82 y.o. male.   Sriman presents with complaints of episodes of feeling shortness of breath. These episodes come and go, with certain circumstances triggering the episodes. Things such as feeling more hot, such as getting into a hot car, when he has gotten too hot in his home when the [facility] was off, or if any sort of tight space. His sister presents with him and mentioned that these episodes have been ongoing for years, which patient agrees with. He denies any current symptoms and feels well currently. He states he feels mildly dizzy with these episodes. Last episode was this morning when he felt very hot in his home. Denies any chest pain, nausea, vomiting, diaphoresis, arm or jaw pain. He has a history of both COPD and anxiety. He endorses that he feels anxiety may be contributing to his episodes. He does take clonopin twice a day for anxiety. He states he has two inhalers for his COPD, he is uncertain what they are except that "one is yellow and one is red." He does not take either of these on a regular basis, states he takes both on an as needed basis. [facility]. He is on eliquis but he is uncertain specifically why. He quit smoking but still vapes. Per chart review was seen for similar presentation 6 mo ago.     ROS per HPI.      Past Medical History:  Diagnosis Date  . Anxiety   . Cataract    bilateral  . COPD (chronic obstructive pulmonary disease) (HCC)     There are no active problems to display for this patient.   History reviewed. No pertinent surgical history.     Home Medications    Prior to Admission medications   Medication Sig Start Date End Date Taking? Authorizing Provider  amLODipine (NORVASC) 2.5 MG tablet Take 2.5 mg by  mouth daily.   Yes [provider]  apixaban (ELIQUIS) 5 MG TABS tablet Take 5 mg by mouth 2 (two) times daily.   Yes [provider]  aspirin EC 81 MG tablet Take 81 mg by mouth daily.   Yes [provider]  clonazePAM (KLONOPIN) 1 MG tablet Take 0.5 tablets (0.5 mg total) by mouth 2 (two) times daily. 02/04/14  Yes Harris, Abigail, PA-C  dronabinol (MARINOL) 5 MG capsule Take 1 capsule (5 mg total) by mouth daily before lunch. 01/12/17   Arnaldo Natal, MD  hydrOXYzine (ATARAX/VISTARIL) 10 MG tablet Take 10 mg by mouth at bedtime as needed for anxiety (for sleep).    [provider]    Family History No family history on file.  Social History Social History   Tobacco Use  . Smoking status: Former Smoker    Last attempt to quit: 05/09/2013    Years since quitting: 4.5  . Smokeless tobacco: Never Used  Substance Use Topics  . Alcohol use: Yes    Comment: occ  . Drug use: Yes    Types: Marijuana     Allergies   Sulfa antibiotics   Review of Systems Review of Systems   Physical Exam Triage Vital Signs ED Triage Vitals  Enc Vitals Group     BP 11/19/17 1848 125/80  Pulse Rate 11/19/17 1846 (!) 57     Resp 11/19/17 1846 20     Temp 11/19/17 1846 98.4 F (36.9 C)     Temp Source 11/19/17 1846 Oral     SpO2 11/19/17 1846 99 %     Weight 11/19/17 1847 179 lb (81.2 kg)     Height --      Head Circumference --      Peak Flow --      Pain Score 11/19/17 1847 0     Pain Loc --      Pain Edu? --      Excl. in GC? --    No data found.  Updated Vital Signs BP 125/80   Pulse (!) 57   Temp 98.4 F (36.9 C) (Oral)   Resp 20   Wt 179 lb (81.2 kg)   SpO2 99%   BMI 24.97 kg/m    Physical Exam  Constitutional: He is oriented to person, place, and time. He appears well-developed and well-nourished.  Non-toxic appearance. He does not appear ill.  Cardiovascular: Normal rate and regular rhythm.  Pulmonary/Chest: Effort normal.  No tachypnea. No respiratory distress. He has no decreased breath sounds. He has wheezes.  Musculoskeletal:  No lower extremity edema present   Neurological: He is alert and oriented to person, place, and time.  Skin: Skin is warm and dry.  Psychiatric: He has a normal mood and affect. His behavior is normal.     UC Treatments / Results  Labs (all labs ordered are listed, but only abnormal results are displayed) Labs Reviewed - No data to display  EKG None  Radiology No results found.  Procedures Procedures (including critical care time)  Medications Ordered in UC Medications - No data to display  Initial Impression / Assessment and Plan / UC Course  I have reviewed the triage vital signs and the nursing notes.  Pertinent labs & imaging results that were available during my care of the patient were reviewed by me and considered in my medical decision making (see chart for details).     Non toxic. Stable vitals. No current symptoms. Episodes per history appear more consistent with episodes of anxiety, as very specific triggers aggravate symptoms. Discussed COPD with patient as well, as suspect that one of his inhalers is a maintenance inhaler which should be used regularly. Encouraged patient to look at labels and contact his physician or pharmacy for clarification as managing his COPD can also be beneficial for him. No current COPD exacerbation. Encouraged close follow up with his pcp for recheck, as feel that better anxiety control may help with his symptoms, counseling or psychiatry consult may be indicated. Return precautions provided. Patient verbalized understanding and agreeable to plan.    Final Clinical Impressions(s) / UC Diagnoses   Final diagnoses:  Generalized anxiety disorder  Chronic obstructive pulmonary disease, unspecified COPD type (HCC)  Shortness of breath     Discharge Instructions     These episodes sound likely related to anxiety.  Please follow  up with your primary care provider for recheck and evaluation as you may need further medication management.  Please check your inhalers, maybe even call your pharmacy to confirm how they are ordered. There is likely one you should be taking every day no matter how your breathing is, and the other you use as needed.  If you develop worsening of shortness of breath , chest pain, dizziness, difficulty breathing please return or go to the Er.  ED Prescriptions    None     Controlled Substance Prescriptions [place] Controlled Substance Registry consulted? Not Applicable   Georgetta Haber, NP 11/20/17 [phone]

## 2017-11-19 NOTE — Discharge Instructions (Signed)
These episodes sound likely related to anxiety.  Please follow up with your primary care provider for recheck and evaluation as you may need further medication management.  Please check your inhalers, maybe even call your pharmacy to confirm how they are ordered. There is likely one you should be taking every day no matter how your breathing is, and the other you use as needed.  If you develop worsening of shortness of breath , chest pain, dizziness, difficulty breathing please return or go to the Er.

## 2017-11-19 NOTE — ED Notes (Signed)
Pt cannot confirm med list, is not sure what he takes daily

## 2017-11-19 NOTE — ED Triage Notes (Signed)
PT reports history of COPD and uses CPAP "some nights." PT reports some SOB over the summer. States, "I can't take the heat. Especially in the car, the seatbelt presses on my stomach."  Denies SOB and CP at this time.

## 2018-03-23 ENCOUNTER — Encounter (HOSPITAL_COMMUNITY): Payer: Self-pay | Admitting: Emergency Medicine

## 2018-03-23 ENCOUNTER — Ambulatory Visit (INDEPENDENT_AMBULATORY_CARE_PROVIDER_SITE_OTHER): Payer: Medicare HMO

## 2018-03-23 ENCOUNTER — Ambulatory Visit (HOSPITAL_COMMUNITY)
Admission: EM | Admit: 2018-03-23 | Discharge: 2018-03-23 | Disposition: A | Discharge status: Home / Self Care | Payer: Medicare HMO

## 2018-03-23 DIAGNOSIS — R0602 Shortness of breath: Secondary | ICD-10-CM | POA: Diagnosis not present

## 2018-03-23 DIAGNOSIS — J441 Chronic obstructive pulmonary disease with (acute) exacerbation: Secondary | ICD-10-CM

## 2018-03-23 DIAGNOSIS — R05 Cough: Secondary | ICD-10-CM | POA: Diagnosis not present

## 2018-03-23 MED ORDER — ALBUTEROL SULFATE (2.5 MG/3ML) 0.083% IN NEBU
INHALATION_SOLUTION | RESPIRATORY_TRACT | Status: AC
  Filled 2018-03-23: qty 3

## 2018-03-23 MED ORDER — ALBUTEROL SULFATE HFA 108 (90 BASE) MCG/ACT IN AERS: 1.0000 | INHALATION_SPRAY | Freq: Four times a day (QID) | RESPIRATORY_TRACT | 4 refills | Status: AC | PRN

## 2018-03-23 MED ORDER — ALBUTEROL SULFATE (2.5 MG/3ML) 0.083% IN NEBU
5.0000 mg | INHALATION_SOLUTION | Freq: Once | RESPIRATORY_TRACT | Status: AC
  Administered 2018-03-23: 5 mg via RESPIRATORY_TRACT

## 2018-03-23 NOTE — ED Triage Notes (Signed)
Pt c/o SOB for several months, keeps getting worse, pt states he ran out of his inhaler today. Pt has trouble speaking in full sentences, is tearful. Denies pain.

## 2018-03-23 NOTE — ED Provider Notes (Addendum)
[facility]    CSN: [id number] Arrival date & time: 03/23/18  1344     History   Chief Complaint Chief Complaint  Patient presents with  . Shortness of Breath    HPI Willie Harris is a 82 y.o. male.   82 year old male presents with shortness of breath times several months and a productive cough times several years.  Patient states that he has recently run out of his albuterol inhaler.  Has not had an increase shortness of breath since that time.  Patient has intermittent difficulty completing sentences and states that when he becomes short of breath he is "unable to think" patient is tearful during evaluation and states that he has a history of anxiety for which she takes Klonopin for.  Patient is a former history of smoking and states that he changed vapes several months prior patient reports quitting vapes after hearing about increased risk of infection and mortality involved.     Past Medical History:  Diagnosis Date  . Anxiety   . Cataract    bilateral  . COPD (chronic obstructive pulmonary disease) (HCC)     There are no active problems to display for this patient.   History reviewed. No pertinent surgical history.     Home Medications    Prior to Admission medications   Medication Sig Start Date End Date Taking? Authorizing Provider  metFORMIN (GLUCOPHAGE) 500 MG tablet Take by mouth 2 (two) times daily with a meal.   Yes [provider]  pravastatin (PRAVACHOL) 40 MG tablet Take 40 mg by mouth daily.   Yes [provider]  amLODipine (NORVASC) 2.5 MG tablet Take 2.5 mg by mouth daily.    [provider]  apixaban (ELIQUIS) 5 MG TABS tablet Take 5 mg by mouth 2 (two) times daily.    [provider]  aspirin EC 81 MG tablet Take 81 mg by mouth daily.    [provider]  clonazePAM (KLONOPIN) 1 MG tablet Take 0.5 tablets (0.5 mg total) by mouth 2 (two) times daily. 02/04/14   Arthor CaptainHarris, Abigail, PA-C    dronabinol (MARINOL) 5 MG capsule Take 1 capsule (5 mg total) by mouth daily before lunch. 01/12/17   Arnaldo Nataliamond, Michael S, MD  hydrOXYzine (ATARAX/VISTARIL) 10 MG tablet Take 10 mg by mouth at bedtime as needed for anxiety (for sleep).    [provider]    Family History No family history on file.  Social History Social History   Tobacco Use  . Smoking status: Former Smoker    Last attempt to quit: 05/09/2013    Years since quitting: 4.8  . Smokeless tobacco: Never Used  Substance Use Topics  . Alcohol use: Yes    Comment: occ  . Drug use: Yes    Types: Marijuana     Allergies   Sulfa antibiotics   Review of Systems Review of Systems   Physical Exam Triage Vital Signs ED Triage Vitals [03/23/18 1353]  Enc Vitals Group     BP (!) 159/92     Pulse Rate 64     Resp 16     Temp 97.8 F (36.6 C)     Temp src      SpO2 97 %     Weight      Height      Head Circumference      Peak Flow      Pain Score 0     Pain Loc  Pain Edu?      Excl. in GC?    No data found.  Updated Vital Signs BP (!) 159/92   Pulse 64   Temp 97.8 F (36.6 C)   Resp 16   SpO2 97%     Physical Exam   UC Treatments / Results  Labs (all labs ordered are listed, but only abnormal results are displayed) Labs Reviewed - No data to display  EKG None  Radiology No results found.  Procedures Procedures (including critical care time)  Medications Ordered in UC Medications  albuterol (PROVENTIL) (2.5 MG/3ML) 0.083% nebulizer solution 5 mg (has no administration in time range)    Initial Impression / Assessment and Plan / UC Course  I have reviewed the triage vital signs and the nursing notes.  Pertinent labs & imaging results that were available during my care of the patient were reviewed by me and considered in my medical decision making (see chart for details).     Patient condition improved with albuterol treatment.  Final Clinical Impressions(s) / UC  Diagnoses   Final diagnoses:  None   Discharge Instructions   None    ED Prescriptions    None     Controlled Substance Prescriptions [place] Controlled Substance Registry consulted?    Alene Mires, NP 03/23/18 1457    Alene Mires, NP 03/23/18 1458

## 2018-05-03 ENCOUNTER — Ambulatory Visit
Admission: EM | Admit: 2018-05-03 | Discharge: 2018-05-03 | Disposition: A | Discharge status: Home / Self Care | Payer: Medicare HMO | Attending: Emergency Medicine | Admitting: Emergency Medicine

## 2018-05-03 ENCOUNTER — Ambulatory Visit: Payer: Medicare HMO

## 2018-05-03 DIAGNOSIS — J441 Chronic obstructive pulmonary disease with (acute) exacerbation: Secondary | ICD-10-CM | POA: Insufficient documentation

## 2018-05-03 MED ORDER — PREDNISONE 50 MG PO TABS: 50.0000 mg | ORAL_TABLET | Freq: Every day | ORAL | 0 refills | Status: AC

## 2018-05-03 MED ORDER — AZITHROMYCIN 250 MG PO TABS: 250.0000 mg | ORAL_TABLET | Freq: Every day | ORAL | 0 refills | Status: DC

## 2018-05-03 MED ORDER — BENZONATATE 200 MG PO CAPS: 200.0000 mg | ORAL_CAPSULE | Freq: Three times a day (TID) | ORAL | 0 refills | Status: AC | PRN

## 2018-05-03 NOTE — ED Triage Notes (Signed)
Pt c/o productive cough with yellow sputum and bodyaches x1wk

## 2018-05-03 NOTE — Discharge Instructions (Addendum)
No pneumonia seen on x-ray Please begin prednisone daily for the next 5 days to help with any wheezing and inflammation in chest Continue albuterol inhaler as needed for shortness of breath and wheezing Please begin azithromycin-2 tablets today, 1 tablet for the following 4 days May use Tessalon as needed for cough every 8 hours Please use Tylenol as needed for body aches and joint pain  Please follow-up if cough worsening, developing worsening discomfort, fever, shortness of breath or difficulty breathing

## 2018-05-03 NOTE — ED Provider Notes (Signed)
[facility]    CSN: [id number] Arrival date & time: 05/03/18  1530     History   Chief Complaint Chief Complaint  Patient presents with  . Cough    HPI Willie Harris is a 83 y.o. male history of COPD, anxiety presenting today for evaluation of cough.  Patient states that he has had a cough for many years, but over the past week he has had worsening cough.  Cough has become associated with body aches and chest discomfort.  Describes the chest discomfort as a knife in his chest.  Cough is been productive.  He denies associated sore throat, ear pain or nasal congestion.  Denies any fevers.  He has not taken any medicines for symptoms.  He does use albuterol inhaler as needed.  He has noticed some increased and wheezing.  Patient has smoked for most of his life, quit cigarettes 3 years ago, took up vaping.  Recently quit vaping approximately 1 to 2 months ago.  Denies leg pain or leg swelling.  HPI  Past Medical History:  Diagnosis Date  . Anxiety   . Cataract    bilateral  . COPD (chronic obstructive pulmonary disease) (HCC)     There are no active problems to display for this patient.   History reviewed. No pertinent surgical history.     Home Medications    Prior to Admission medications   Medication Sig Start Date End Date Taking? Authorizing Provider  albuterol (PROVENTIL HFA;VENTOLIN HFA) 108 (90 Base) MCG/ACT inhaler Inhale 1-2 puffs into the lungs every 6 (six) hours as needed for wheezing or shortness of breath. 03/23/18   Alene Mires, NP  amLODipine (NORVASC) 2.5 MG tablet Take 2.5 mg by mouth daily.    [provider]  apixaban (ELIQUIS) 5 MG TABS tablet Take 5 mg by mouth 2 (two) times daily.    [provider]  aspirin EC 81 MG tablet Take 81 mg by mouth daily.    [provider]  azithromycin (ZITHROMAX) 250 MG tablet Take 1 tablet (250 mg total) by mouth daily. Take first 2 tablets together, then 1 every day  until finished. 05/03/18   Sharra Cayabyab C, PA-C  benzonatate (TESSALON) 200 MG capsule Take 1 capsule (200 mg total) by mouth 3 (three) times daily as needed for up to 7 days for cough. 05/03/18 05/10/18  Deeric Cruise C, PA-C  clonazePAM (KLONOPIN) 1 MG tablet Take 0.5 tablets (0.5 mg total) by mouth 2 (two) times daily. 02/04/14   Arthor Captain, PA-C  dronabinol (MARINOL) 5 MG capsule Take 1 capsule (5 mg total) by mouth daily before lunch. 01/12/17   Arnaldo Natal, MD  hydrOXYzine (ATARAX/VISTARIL) 10 MG tablet Take 10 mg by mouth at bedtime as needed for anxiety (for sleep).    [provider]  metFORMIN (GLUCOPHAGE) 500 MG tablet Take by mouth 2 (two) times daily with a meal.    [provider]  pravastatin (PRAVACHOL) 40 MG tablet Take 40 mg by mouth daily.    [provider]  predniSONE (DELTASONE) 50 MG tablet Take 1 tablet (50 mg total) by mouth daily with breakfast for 5 days. 05/03/18 05/08/18  Brean Carberry, Junius Creamer, PA-C    Family History No family history on file.  Social History Social History   Tobacco Use  . Smoking status: Former Smoker    Last attempt to quit: 05/09/2013    Years since quitting: 4.9  . Smokeless tobacco: Never Used  Substance Use Topics  . Alcohol use: Yes    Comment: occ  . Drug use: Yes    Types: Marijuana     Allergies   Sulfa antibiotics   Review of Systems Review of Systems  Constitutional: Negative for activity change, appetite change, chills, fatigue and fever.  HENT: Positive for congestion. Negative for ear pain, rhinorrhea, sinus pressure, sore throat and trouble swallowing.   Eyes: Negative for discharge and redness.  Respiratory: Positive for cough. Negative for chest tightness and shortness of breath.   Cardiovascular: Negative for chest pain.  Gastrointestinal: Negative for abdominal pain, diarrhea, nausea and vomiting.  Musculoskeletal: Positive for arthralgias and myalgias.  Skin: Negative for  rash.  Neurological: Negative for dizziness, light-headedness and headaches.     Physical Exam Triage Vital Signs ED Triage Vitals [05/03/18 1543]  Enc Vitals Group     BP 125/80     Pulse Rate 73     Resp 18     Temp 97.8 F (36.6 C)     Temp Source Oral     SpO2 93 %     Weight      Height      Head Circumference      Peak Flow      Pain Score 0     Pain Loc      Pain Edu?      Excl. in [place]?    No data found.  Updated Vital Signs BP 125/80 (BP Location: Left Arm)   Pulse 73   Temp 97.8 F (36.6 C) (Oral)   Resp 18   SpO2 93%  O2 rechecked 94% Visual Acuity Right Eye Distance:   Left Eye Distance:   Bilateral Distance:    Right Eye Near:   Left Eye Near:    Bilateral Near:     Physical Exam Vitals signs and nursing note reviewed.  Constitutional:      Appearance: He is well-developed.  HENT:     Head: Normocephalic and atraumatic.     Ears:     Comments: Bilateral cerumen impaction, TMs not visualized    Nose:     Comments: Nasal mucosa pink, no rhinorrhea    Mouth/Throat:     Comments: Oral mucosa pink and moist, no tonsillar enlargement or exudate. Posterior pharynx patent and nonerythematous, no uvula deviation or swelling. Normal phonation. Eyes:     Conjunctiva/sclera: Conjunctivae normal.  Neck:     Musculoskeletal: Neck supple.  Cardiovascular:     Rate and Rhythm: Normal rate and regular rhythm.     Heart sounds: No murmur.  Pulmonary:     Effort: Pulmonary effort is normal. No respiratory distress.     Breath sounds: Normal breath sounds.     Comments: Breathing comfortably at rest, faint wheezing.  To left upper lung fields Abdominal:     Palpations: Abdomen is soft.     Tenderness: There is no abdominal tenderness.  Skin:    General: Skin is warm and dry.  Neurological:     Mental Status: He is alert.      UC Treatments / Results  Labs (all labs ordered are listed, but only abnormal results are displayed) Labs Reviewed - No  data to display  EKG None  Radiology Dg Chest 2 View  Result Date: 05/03/2018 CLINICAL DATA:  Cough, chest discomfort for 1 week EXAM: CHEST - 2 VIEW COMPARISON:  03/23/2018 FINDINGS: There is mild left basilar atelectasis. There is no focal parenchymal opacity. There  is no pleural effusion or pneumothorax. The heart and mediastinal contours are unremarkable. The osseous structures are unremarkable. IMPRESSION: No active cardiopulmonary disease. Electronically Signed   By: Elige KoHetal  Patel   On: 05/03/2018 16:07    Procedures Procedures (including critical care time)  Medications Ordered in UC Medications - No data to display  Initial Impression / Assessment and Plan / UC Course  I have reviewed the triage vital signs and the nursing notes.  Pertinent labs & imaging results that were available during my care of the patient were reviewed by me and considered in my medical decision making (see chart for details).    X-ray negative for pneumonia, EKG normal sinus rhythm, occasional PVC and PAC.  No acute signs of ischemia or infarction.  Will treat for COPD exacerbation.  Will give azithromycin to cover atypical respiratory infection, prednisone to help with wheezing and inflammation.  Continue albuterol as needed.  Tessalon as needed for cough.  Patient is on Eliquis, do not suspect underlying PE.Discussed strict return precautions. Patient verbalized understanding and is agreeable with plan.  Final Clinical Impressions(s) / UC Diagnoses   Final diagnoses:  COPD exacerbation (HCC)     Discharge Instructions     No pneumonia seen on x-ray Please begin prednisone daily for the next 5 days to help with any wheezing and inflammation in chest Continue albuterol inhaler as needed for shortness of breath and wheezing Please begin azithromycin-2 tablets today, 1 tablet for the following 4 days May use Tessalon as needed for cough every 8 hours Please use Tylenol as needed for body aches and  joint pain  Please follow-up if cough worsening, developing worsening discomfort, fever, shortness of breath or difficulty breathing    ED Prescriptions    Medication Sig Dispense Auth. Provider   azithromycin (ZITHROMAX) 250 MG tablet Take 1 tablet (250 mg total) by mouth daily. Take first 2 tablets together, then 1 every day until finished. 6 tablet Micaela Stith C, PA-C   predniSONE (DELTASONE) 50 MG tablet Take 1 tablet (50 mg total) by mouth daily with breakfast for 5 days. 5 tablet Kandi Brusseau C, PA-C   benzonatate (TESSALON) 200 MG capsule Take 1 capsule (200 mg total) by mouth 3 (three) times daily as needed for up to 7 days for cough. 28 capsule Cyrena Kuchenbecker C, PA-C     Controlled Substance Prescriptions [place] Controlled Substance Registry consulted? Not Applicable   Lew DawesWieters, Torrey Horseman C, [place]PA-C 05/03/18 1635

## 2018-10-07 ENCOUNTER — Other Ambulatory Visit: Payer: Self-pay | Admitting: *Deleted

## 2018-10-07 DIAGNOSIS — Z20822 Contact with and (suspected) exposure to covid-19: Secondary | ICD-10-CM

## 2018-10-13 LAB — NOVEL CORONAVIRUS, NAA: SARS-CoV-2, NAA: NOT DETECTED

## 2018-11-12 ENCOUNTER — Encounter: Payer: Self-pay | Admitting: Emergency Medicine

## 2018-11-12 ENCOUNTER — Ambulatory Visit
Admission: EM | Admit: 2018-11-12 | Discharge: 2018-11-12 | Disposition: A | Discharge status: Home / Self Care | Payer: Medicare Other

## 2018-11-12 ENCOUNTER — Other Ambulatory Visit: Payer: Self-pay

## 2018-11-12 ENCOUNTER — Ambulatory Visit (INDEPENDENT_AMBULATORY_CARE_PROVIDER_SITE_OTHER): Payer: Medicare Other

## 2018-11-12 DIAGNOSIS — R0602 Shortness of breath: Secondary | ICD-10-CM

## 2018-11-12 DIAGNOSIS — J441 Chronic obstructive pulmonary disease with (acute) exacerbation: Secondary | ICD-10-CM | POA: Diagnosis not present

## 2018-11-12 MED ORDER — PREDNISONE 20 MG PO TABS: 20.0000 mg | ORAL_TABLET | Freq: Every day | ORAL | 0 refills | Status: AC

## 2018-11-12 NOTE — Discharge Instructions (Addendum)
USE RED PUMP 2 PUFFS 2 TIMES A DAY USE GRAY PUMP 2 PUFFS WHEN NEEDED Take steroid as prescribed. Return if you develop worsening shortness of breath, cough, lightheadedness, dizziness, chest pain.  Follow-up with PCP in 1 week for reevaluation.

## 2018-11-12 NOTE — ED Provider Notes (Signed)
[facility]    CSN: [id number] Arrival date & time: 11/12/18  1104     History   Chief Complaint Chief Complaint  Patient presents with  . Shortness of Breath    HPI Willie Harris is a 83 y.o. male with history of COPD, anxiety presenting for 1 day course of increased shortness of breath.  Patient states that heat has been aggravating his breathing.  Denies recent URI, illness, fever, chest pain, productive or hemoptic cough.  Of note, patient has not been using his Symbicort 2 puffs twice daily as instructed.  Appears to only be using his albuterol at home as needed: Unsure of how many times has had to use this.  Last use it this morning with adequate relief of shortness of breath.  Patient denies recent change in medications, steroid or antibiotic use.  Patient last seen for COPD exacerbation in January, note reviewed.  Patient denies hospitalization since.  Past Medical History:  Diagnosis Date  . Anxiety   . Cataract    bilateral  . COPD (chronic obstructive pulmonary disease) (HCC)     There are no active problems to display for this patient.   History reviewed. No pertinent surgical history.     Home Medications    Prior to Admission medications   Medication Sig Start Date End Date Taking? Authorizing Provider  budesonide-formoterol (SYMBICORT) 160-4.5 MCG/ACT inhaler Inhale 2 puffs into the lungs 2 (two) times daily.   Yes [provider]  albuterol (PROVENTIL HFA;VENTOLIN HFA) 108 (90 Base) MCG/ACT inhaler Inhale 1-2 puffs into the lungs every 6 (six) hours as needed for wheezing or shortness of breath. 03/23/18   Alene Miresmohundro, Jennifer C, NP  amLODipine (NORVASC) 2.5 MG tablet Take 2.5 mg by mouth daily.    [provider]  apixaban (ELIQUIS) 5 MG TABS tablet Take 5 mg by mouth 2 (two) times daily.    [provider]  aspirin EC 81 MG tablet Take 81 mg by mouth daily.    [provider]  clonazePAM (KLONOPIN) 1 MG  tablet Take 0.5 tablets (0.5 mg total) by mouth 2 (two) times daily. 02/04/14   Arthor CaptainHarris, Abigail, PA-C  dronabinol (MARINOL) 5 MG capsule Take 1 capsule (5 mg total) by mouth daily before lunch. 01/12/17   Arnaldo Nataliamond, Michael S, MD  hydrOXYzine (ATARAX/VISTARIL) 10 MG tablet Take 10 mg by mouth at bedtime as needed for anxiety (for sleep).    [provider]  metFORMIN (GLUCOPHAGE) 500 MG tablet Take by mouth 2 (two) times daily with a meal.    [provider]  pravastatin (PRAVACHOL) 40 MG tablet Take 40 mg by mouth daily.    [provider]  predniSONE (DELTASONE) 20 MG tablet Take 1 tablet (20 mg total) by mouth daily for 7 days. 11/12/18 11/19/18  Hall-Potvin, [place], PA-C    Family History History reviewed. No pertinent family history.  Social History Social History   Tobacco Use  . Smoking status: Former Smoker    Quit date: 05/09/2013    Years since quitting: 5.5  . Smokeless tobacco: Never Used  Substance Use Topics  . Alcohol use: Yes    Comment: occ  . Drug use: Yes    Types: Marijuana     Allergies   Sulfa antibiotics   Review of Systems Review of Systems  Constitutional: Negative for fatigue and fever.  HENT: Negative for congestion, dental problem, drooling, ear discharge, ear pain, facial swelling, hearing loss, mouth sores, nosebleeds, postnasal  drip, rhinorrhea, sinus pressure, sinus pain, sneezing, sore throat, tinnitus, trouble swallowing and voice change.   Eyes: Negative for pain, redness and itching.  Respiratory: Positive for shortness of breath. Negative for cough, choking, chest tightness, wheezing and stridor.   Cardiovascular: Negative for chest pain, palpitations and leg swelling.  Gastrointestinal: Negative for abdominal pain, diarrhea, nausea and vomiting.  Musculoskeletal: Negative for arthralgias and myalgias.  Skin: Negative for rash and wound.  Neurological: Negative for dizziness, tremors, speech difficulty, weakness,  light-headedness, numbness and headaches.  All other systems reviewed and are negative.    Physical Exam Triage Vital Signs ED Triage Vitals [11/12/18 1118]  Enc Vitals Group     BP 135/83     Pulse Rate 65     Resp 20     Temp 98.2 F (36.8 C)     Temp Source Oral     SpO2 94 %     Weight      Height      Head Circumference      Peak Flow      Pain Score 0     Pain Loc      Pain Edu?      Excl. in [place]?    No data found.  Updated Vital Signs BP 135/83 (BP Location: Left Arm)   Pulse 67   Temp 98.2 F (36.8 C) (Oral)   Resp 20   SpO2 97%   Visual Acuity Right Eye Distance:   Left Eye Distance:   Bilateral Distance:    Right Eye Near:   Left Eye Near:    Bilateral Near:     Physical Exam Constitutional:      General: He is not in acute distress. HENT:     Head: Normocephalic and atraumatic.     Mouth/Throat:     Mouth: Mucous membranes are moist.     Pharynx: Oropharynx is clear. No pharyngeal swelling or oropharyngeal exudate.  Eyes:     General: No scleral icterus.    Pupils: Pupils are equal, round, and reactive to light.  Neck:     Vascular: No JVD.     Trachea: No tracheal deviation.  Cardiovascular:     Rate and Rhythm: Normal rate and regular rhythm.  Pulmonary:     Effort: Pulmonary effort is normal.     Breath sounds: Examination of the right-lower field reveals decreased breath sounds. Examination of the left-lower field reveals decreased breath sounds. Decreased breath sounds present. No wheezing, rhonchi or rales.  Chest:     Chest wall: No deformity, tenderness or crepitus.  Musculoskeletal: Normal range of motion.     Right lower leg: He exhibits no tenderness. No edema.     Left lower leg: He exhibits no tenderness. No edema.     Comments: Negative Homans sign  Lymphadenopathy:     Cervical: No cervical adenopathy.  Skin:    General: Skin is warm.     Capillary Refill: Capillary refill takes less than 2 seconds.     Coloration:  Skin is not cyanotic, jaundiced or pale.  Neurological:     General: No focal deficit present.     Mental Status: He is alert and oriented to person, place, and time.  Psychiatric:        Mood and Affect: Mood normal.        Behavior: Behavior normal.      UC Treatments / Results  Labs (all labs ordered are listed, but only abnormal results  are displayed) Labs Reviewed - No data to display  EKG   Radiology Dg Chest 2 View  Result Date: 11/12/2018 CLINICAL DATA:  Increased shortness of breath, cough, history COPD, former smoker EXAM: CHEST - 2 VIEW COMPARISON:  05/03/2018 FINDINGS: Normal heart size, mediastinal contours, and pulmonary vascularity. Atherosclerotic calcification aorta. Emphysematous and bronchitic changes consistent with COPD. Minimal LEFT basilar scarring stable. No acute infiltrate, pleural effusion, or pneumothorax. No acute osseous findings. IMPRESSION: Changes of COPD with LEFT basilar scarring. No acute abnormalities. Electronically Signed   By: Ulyses SouthwardMark  Boles M.D.   On: 11/12/2018 12:17    Procedures Procedures (including critical care time)  Medications Ordered in UC Medications - No data to display  Initial Impression / Assessment and Plan / UC Course  I have reviewed the triage vital signs and the nursing notes.  Pertinent labs & imaging results that were available during my care of the patient were reviewed by me and considered in my medical decision making (see chart for details).     1.  COPD with exacerbation Chest x-ray done in office, reviewed by radiology: "COPD changes with left basilar scarring, no acute abnormalities." Likely due to poor medication compliance.  Reiterated importance of Symbicort compliance for improved baseline functioning in setting of COPD.  Patient verbalized understanding, and wrote in large lettering which patient read back.  Will provide 1 week of steroid for additional symptom relief. Return precautions discussed, patient  verbalized understanding and is agreeable to plan.  Final Clinical Impressions(s) / UC Diagnoses   Final diagnoses:  COPD with exacerbation (HCC)     Discharge Instructions     USE RED PUMP 2 PUFFS 2 TIMES A DAY USE GRAY PUMP 2 PUFFS WHEN NEEDED Take steroid as prescribed. Return if you develop worsening shortness of breath, cough, lightheadedness, dizziness, chest pain.  Follow-up with PCP in 1 week for reevaluation.    ED Prescriptions    Medication Sig Dispense Auth. Provider   predniSONE (DELTASONE) 20 MG tablet Take 1 tablet (20 mg total) by mouth daily for 7 days. 7 tablet Hall-Potvin, [place], PA-C     Controlled Substance Prescriptions  Controlled Substance Registry consulted? Not Applicable   Shea EvansHall-Potvin, Brittany, [place]PA-C 11/12/18 1231

## 2018-11-12 NOTE — ED Triage Notes (Signed)
Pt presents to [facility] for assessment of shortness of breath, hx of COPD.  States he has been making more phlegm and post-nasal drip than normal.  Patient states heat has been impacting his morning SOB, and states this morning it was more intense than normal.  Denies CP.  Patient states he has had more fatigue "for a while".  Denies light-headedness, c/o chronic dizziness.  Denies back pain, or any other pain.  States his inhaler helped ease his SOB this morning.

## 2018-11-12 NOTE — ED Notes (Signed)
Patient able to ambulate independently  

## 2019-05-17 ENCOUNTER — Ambulatory Visit: Payer: Medicare Other

## 2019-05-23 ENCOUNTER — Ambulatory Visit: Payer: Medicare Other | Attending: Internal Medicine

## 2019-05-23 DIAGNOSIS — Z23 Encounter for immunization: Secondary | ICD-10-CM | POA: Insufficient documentation

## 2019-06-17 ENCOUNTER — Ambulatory Visit: Payer: Medicare Other | Attending: Internal Medicine

## 2019-06-17 DIAGNOSIS — Z23 Encounter for immunization: Secondary | ICD-10-CM

## 2019-06-17 NOTE — Progress Notes (Signed)
   Covid-19 Vaccination Clinic  Name:  Willie Harris    MRN: [id number] DOB: 01-Apr-1936  06/17/2019  [name] was observed post Covid-19 immunization for 15 minutes without incident. He was provided with Vaccine Information Sheet and instruction to access the V-Safe system.   [name] was instructed to call 911 with any severe reactions post vaccine: Marland Kitchen Difficulty breathing  . Swelling of face and throat  . A fast heartbeat  . A bad rash all over body  . Dizziness and weakness   Immunizations Administered    Name Date Dose VIS Date Route   Pfizer COVID-19 Vaccine 06/17/2019  2:09 PM 0.3 mL 03/28/2019 Intramuscular   Manufacturer: [employer], [employer]   Lot: [id number]   NDC: [address]-4

## 2019-12-31 ENCOUNTER — Other Ambulatory Visit: Payer: Medicare Other

## 2019-12-31 ENCOUNTER — Other Ambulatory Visit: Payer: Self-pay

## 2019-12-31 DIAGNOSIS — Z20822 Contact with and (suspected) exposure to covid-19: Secondary | ICD-10-CM

## 2020-01-02 LAB — SARS-COV-2, NAA 2 DAY TAT

## 2020-01-02 LAB — NOVEL CORONAVIRUS, NAA: SARS-CoV-2, NAA: NOT DETECTED

## 2020-02-14 ENCOUNTER — Ambulatory Visit: Payer: Medicare Other | Attending: Internal Medicine

## 2020-02-14 DIAGNOSIS — Z23 Encounter for immunization: Secondary | ICD-10-CM

## 2020-02-14 NOTE — Progress Notes (Signed)
   Covid-19 Vaccination Clinic  Name:  Willie Harris    MRN: [id number] DOB: 1935-08-09  02/14/2020  Mr. [name] was observed post Covid-19 immunization for 15 minutes without incident. He was provided with Vaccine Information Sheet and instruction to access the V-Safe system.   Mr. [name] was instructed to call 911 with any severe reactions post vaccine: Marland Kitchen Difficulty breathing  . Swelling of face and throat  . A fast heartbeat  . A bad rash all over body  . Dizziness and weakness

## 2020-04-13 IMAGING — DX DG CHEST 2V
2 series · 2 of 2 positions shown · non-contrast
Comparison: 03/23/2018

CLINICAL DATA: Cough, chest discomfort for 1 week

EXAM:
CHEST - 2 VIEW

[chest pa]
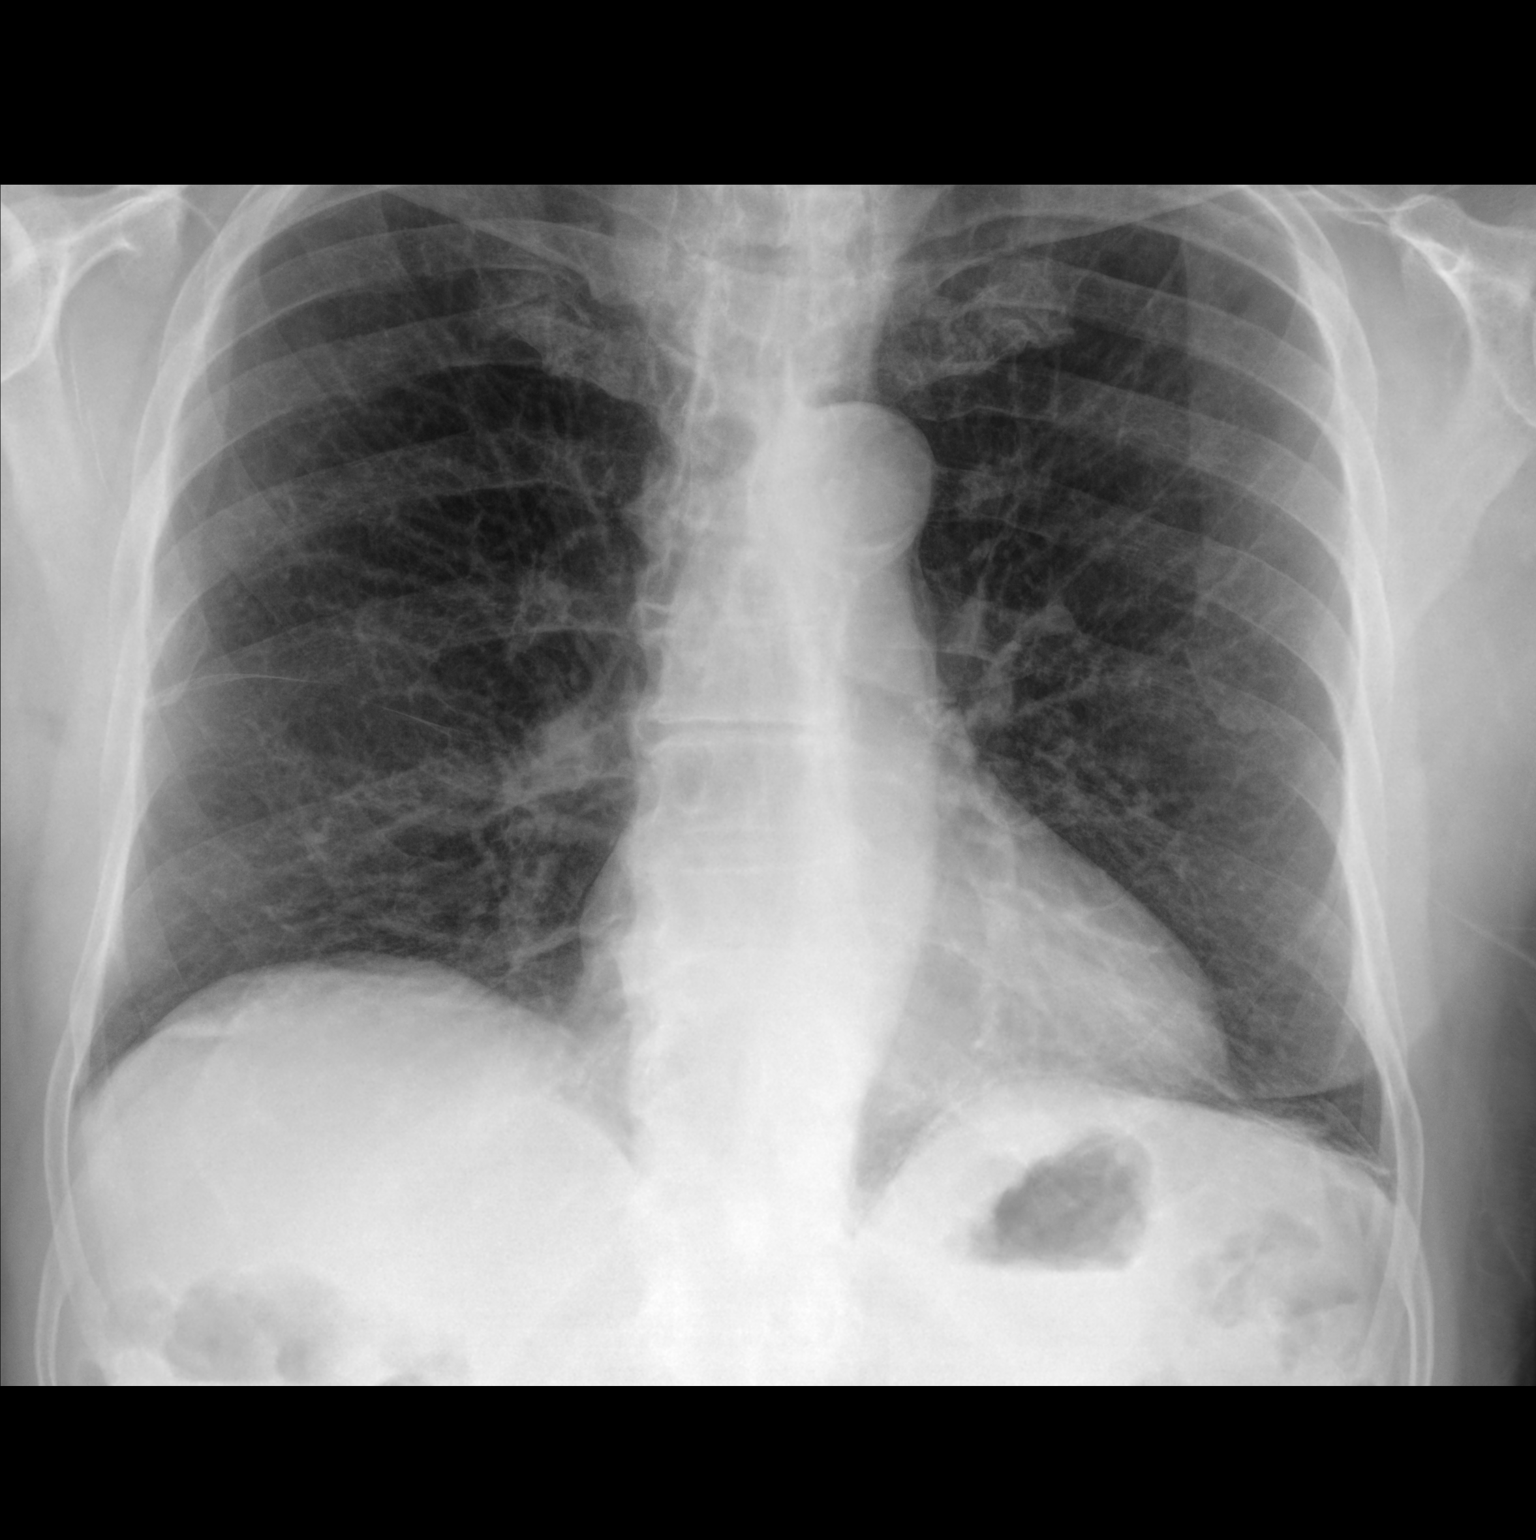

[chest lat]
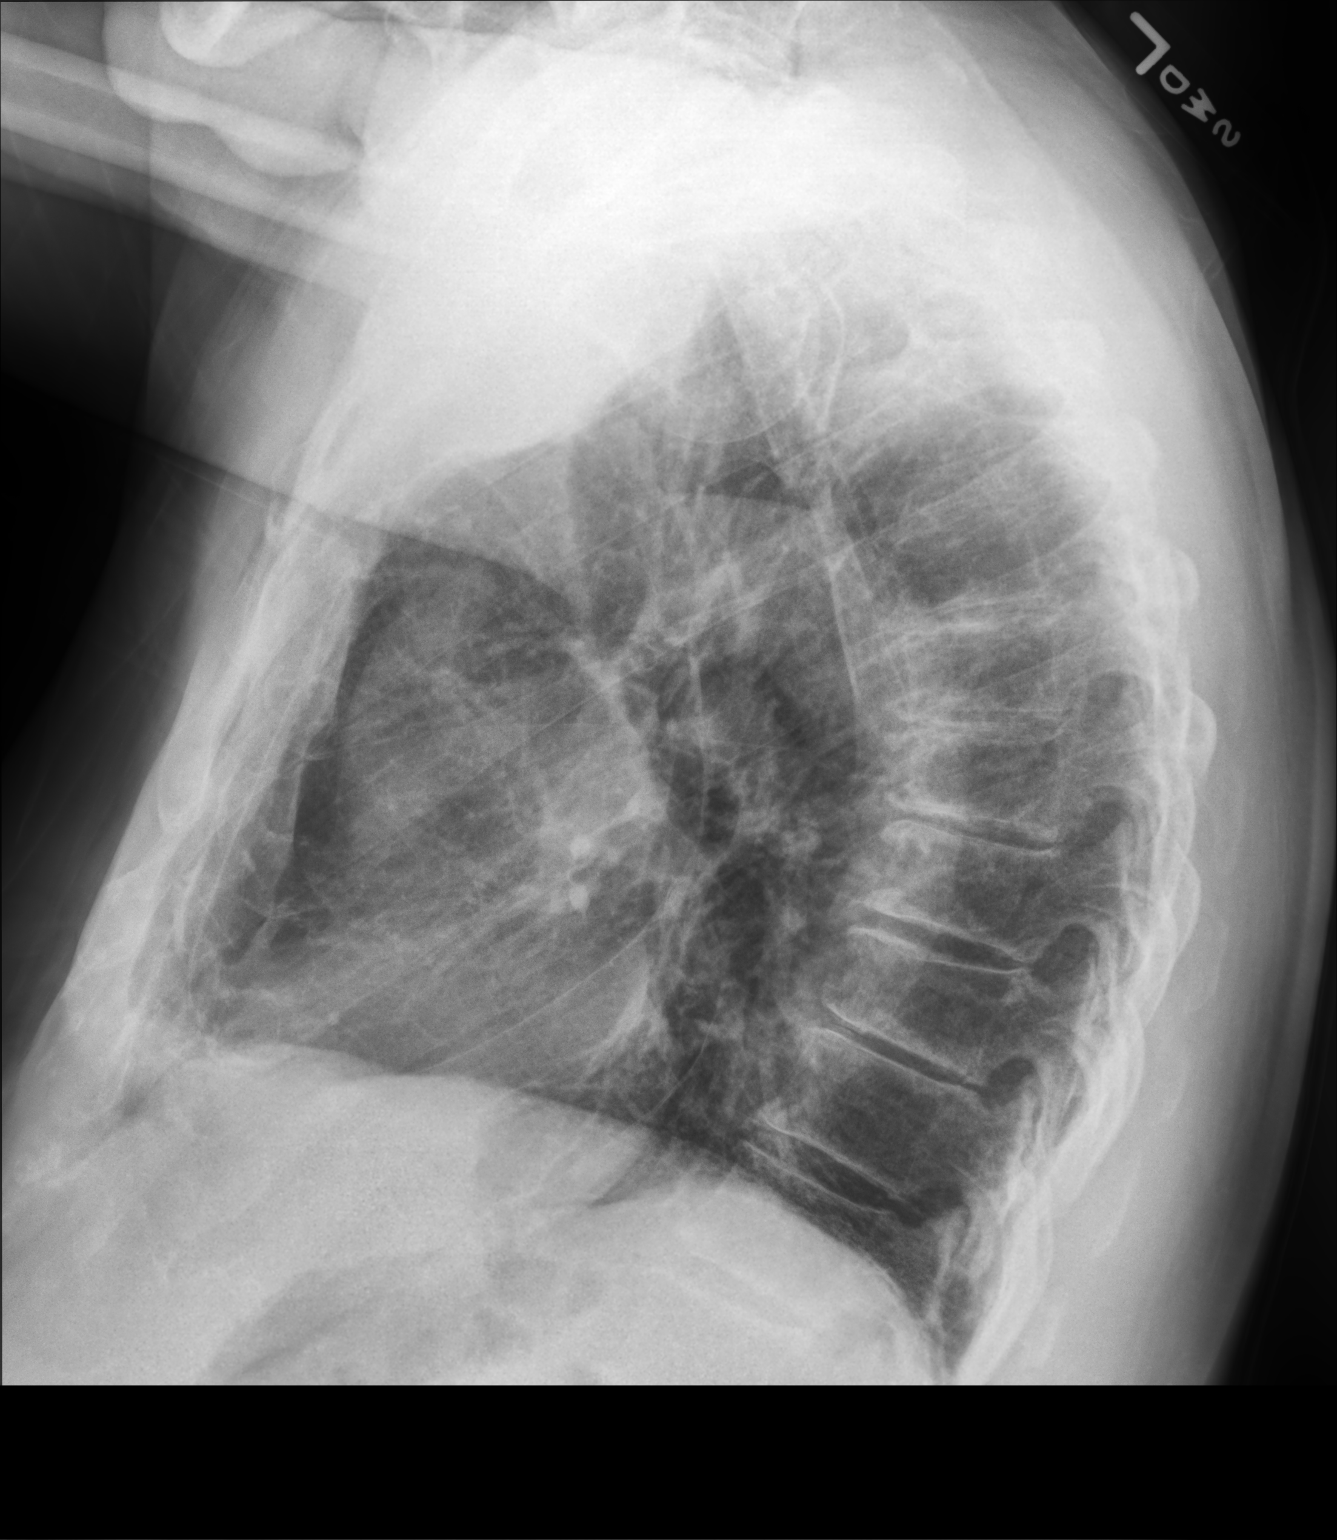

[2 of 2 positions shown; findings below may reference images not displayed]

FINDINGS: There is mild left basilar atelectasis. There is no focal
parenchymal opacity. There is no pleural effusion or pneumothorax.
The heart and mediastinal contours are unremarkable.

The osseous structures are unremarkable.
IMPRESSION: No active cardiopulmonary disease.

## 2020-06-01 ENCOUNTER — Other Ambulatory Visit: Payer: Self-pay | Admitting: Internal Medicine

## 2020-06-02 LAB — COMPLETE METABOLIC PANEL WITH GFR
AG Ratio: 1.6 (calc) (ref 1.0–2.5)
ALT: 14 U/L (ref 9–46)
AST: 18 U/L (ref 10–35)
Albumin: 4.4 g/dL (ref 3.6–5.1)
Alkaline phosphatase (APISO): 59 U/L (ref 35–144)
BUN: 16 mg/dL (ref 7–25)
CO2: 23 mmol/L (ref 20–32)
Calcium: 9.3 mg/dL (ref 8.6–10.3)
Chloride: 105 mmol/L (ref 98–110)
Creat: 1.07 mg/dL (ref 0.70–1.11)
GFR, Est African American: 75 mL/min/{1.73_m2} (ref >=60)
GFR, Est Non African American: 64 mL/min/{1.73_m2} (ref >=60)
Globulin: 2.7 g/dL (calc) (ref 1.9–3.7)
Glucose, Bld: 152 mg/dL — ABNORMAL HIGH (ref 65–99)
Potassium: 4.4 mmol/L (ref 3.5–5.3)
Sodium: 139 mmol/L (ref 135–146)
Total Bilirubin: 0.6 mg/dL (ref 0.2–1.2)
Total Protein: 7.1 g/dL (ref 6.1–8.1)

## 2020-06-02 LAB — LIPID PANEL
Cholesterol: 208 mg/dL — ABNORMAL HIGH (ref <200)
HDL: 50 mg/dL (ref >=40)
LDL Cholesterol (Calc): 122 mg/dL (calc) — ABNORMAL HIGH
Non-HDL Cholesterol (Calc): 158 mg/dL (calc) — ABNORMAL HIGH (ref <130)
Total CHOL/HDL Ratio: 4.2 (calc) (ref <5.0)
Triglycerides: 236 mg/dL — ABNORMAL HIGH (ref <150)

## 2020-06-02 LAB — CBC
HCT: 46.3 % (ref 38.5–50.0)
Hemoglobin: 15.3 g/dL (ref 13.2–17.1)
MCH: 29.7 pg (ref 27.0–33.0)
MCHC: 33 g/dL (ref 32.0–36.0)
MCV: 89.9 fL (ref 80.0–100.0)
MPV: 11.6 fL (ref 7.5–12.5)
Platelets: 220 10*3/uL (ref 140–400)
RBC: 5.15 10*6/uL (ref 4.20–5.80)
RDW: 12.7 % (ref 11.0–15.0)
WBC: 7.8 10*3/uL (ref 3.8–10.8)

## 2020-06-02 LAB — TSH: TSH: 1.93 mIU/L (ref 0.40–4.50)

## 2020-10-23 IMAGING — DX CHEST - 2 VIEW
2 series · 2 of 2 positions shown · non-contrast
Comparison: 05/03/2018

CLINICAL DATA: Increased shortness of breath, cough, history COPD,
former smoker

EXAM:
CHEST - 2 VIEW

[chest pa]
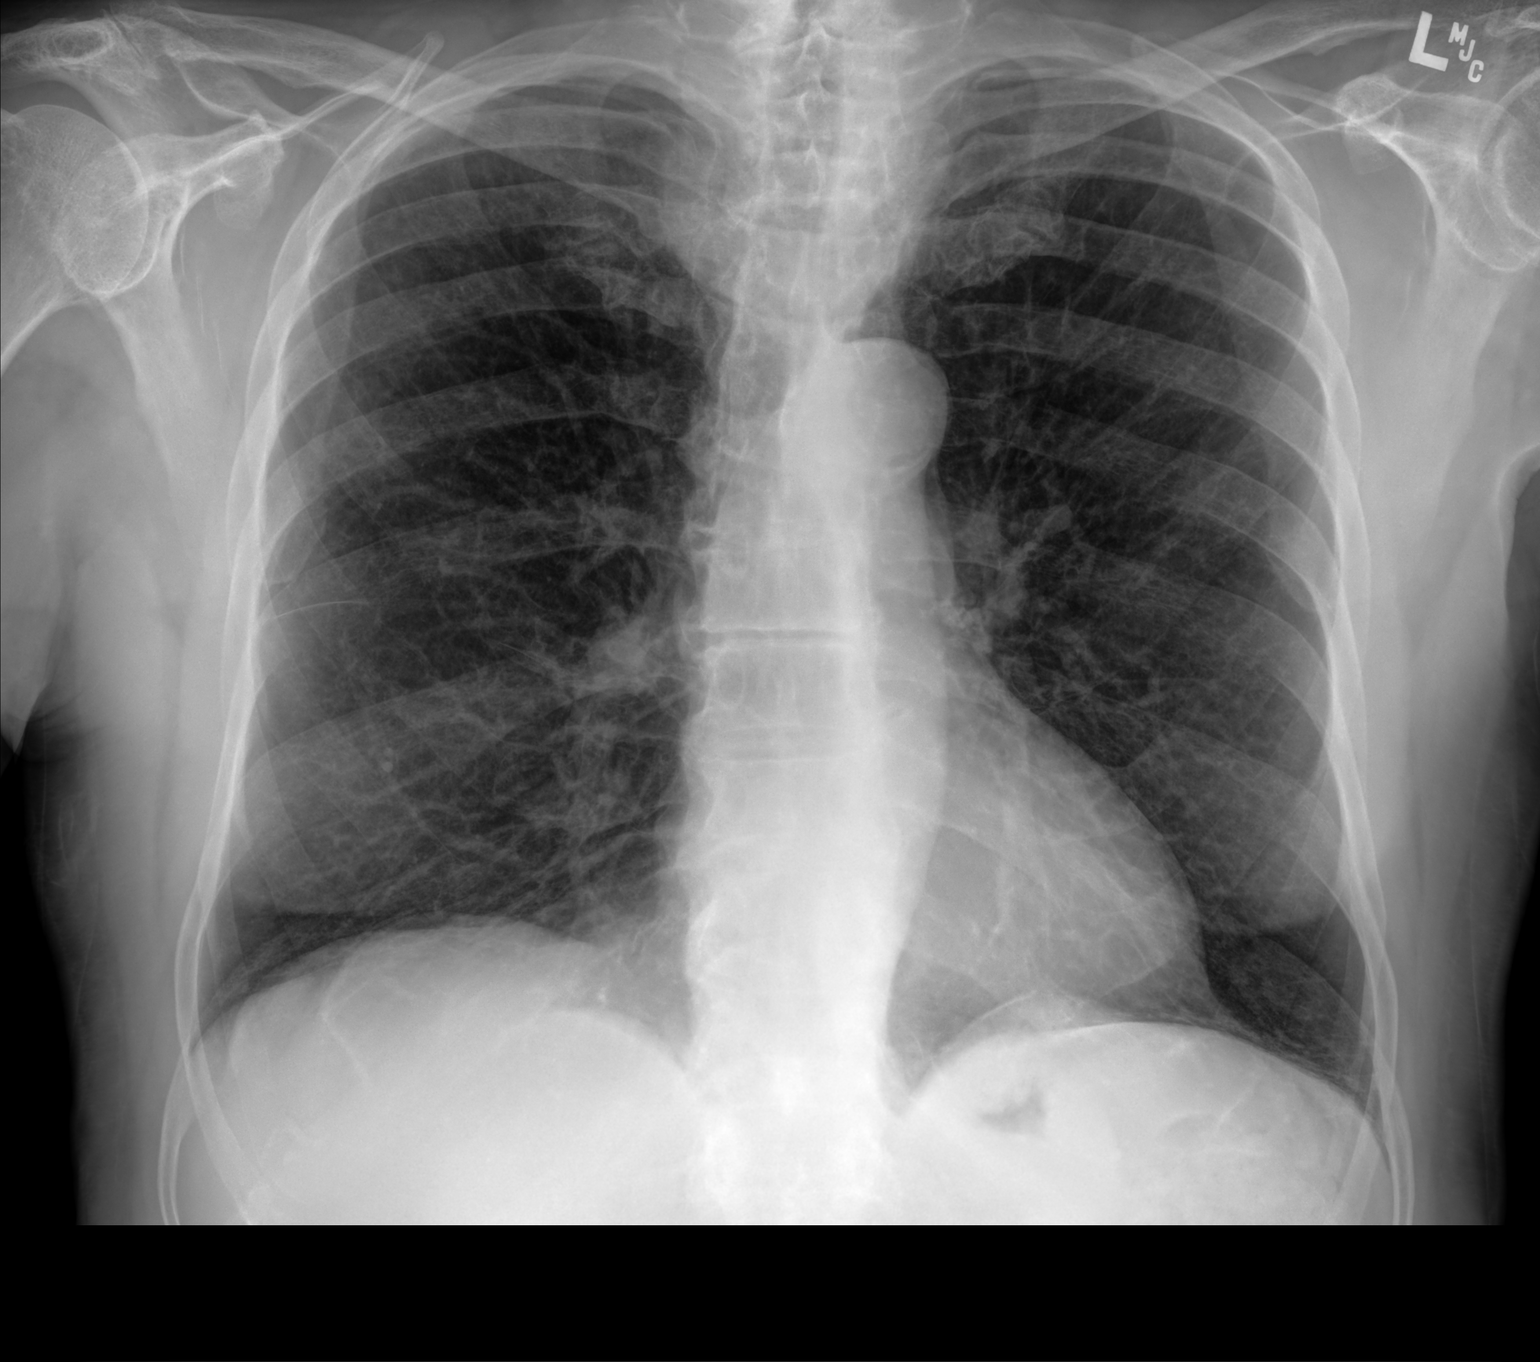

[chest lat]
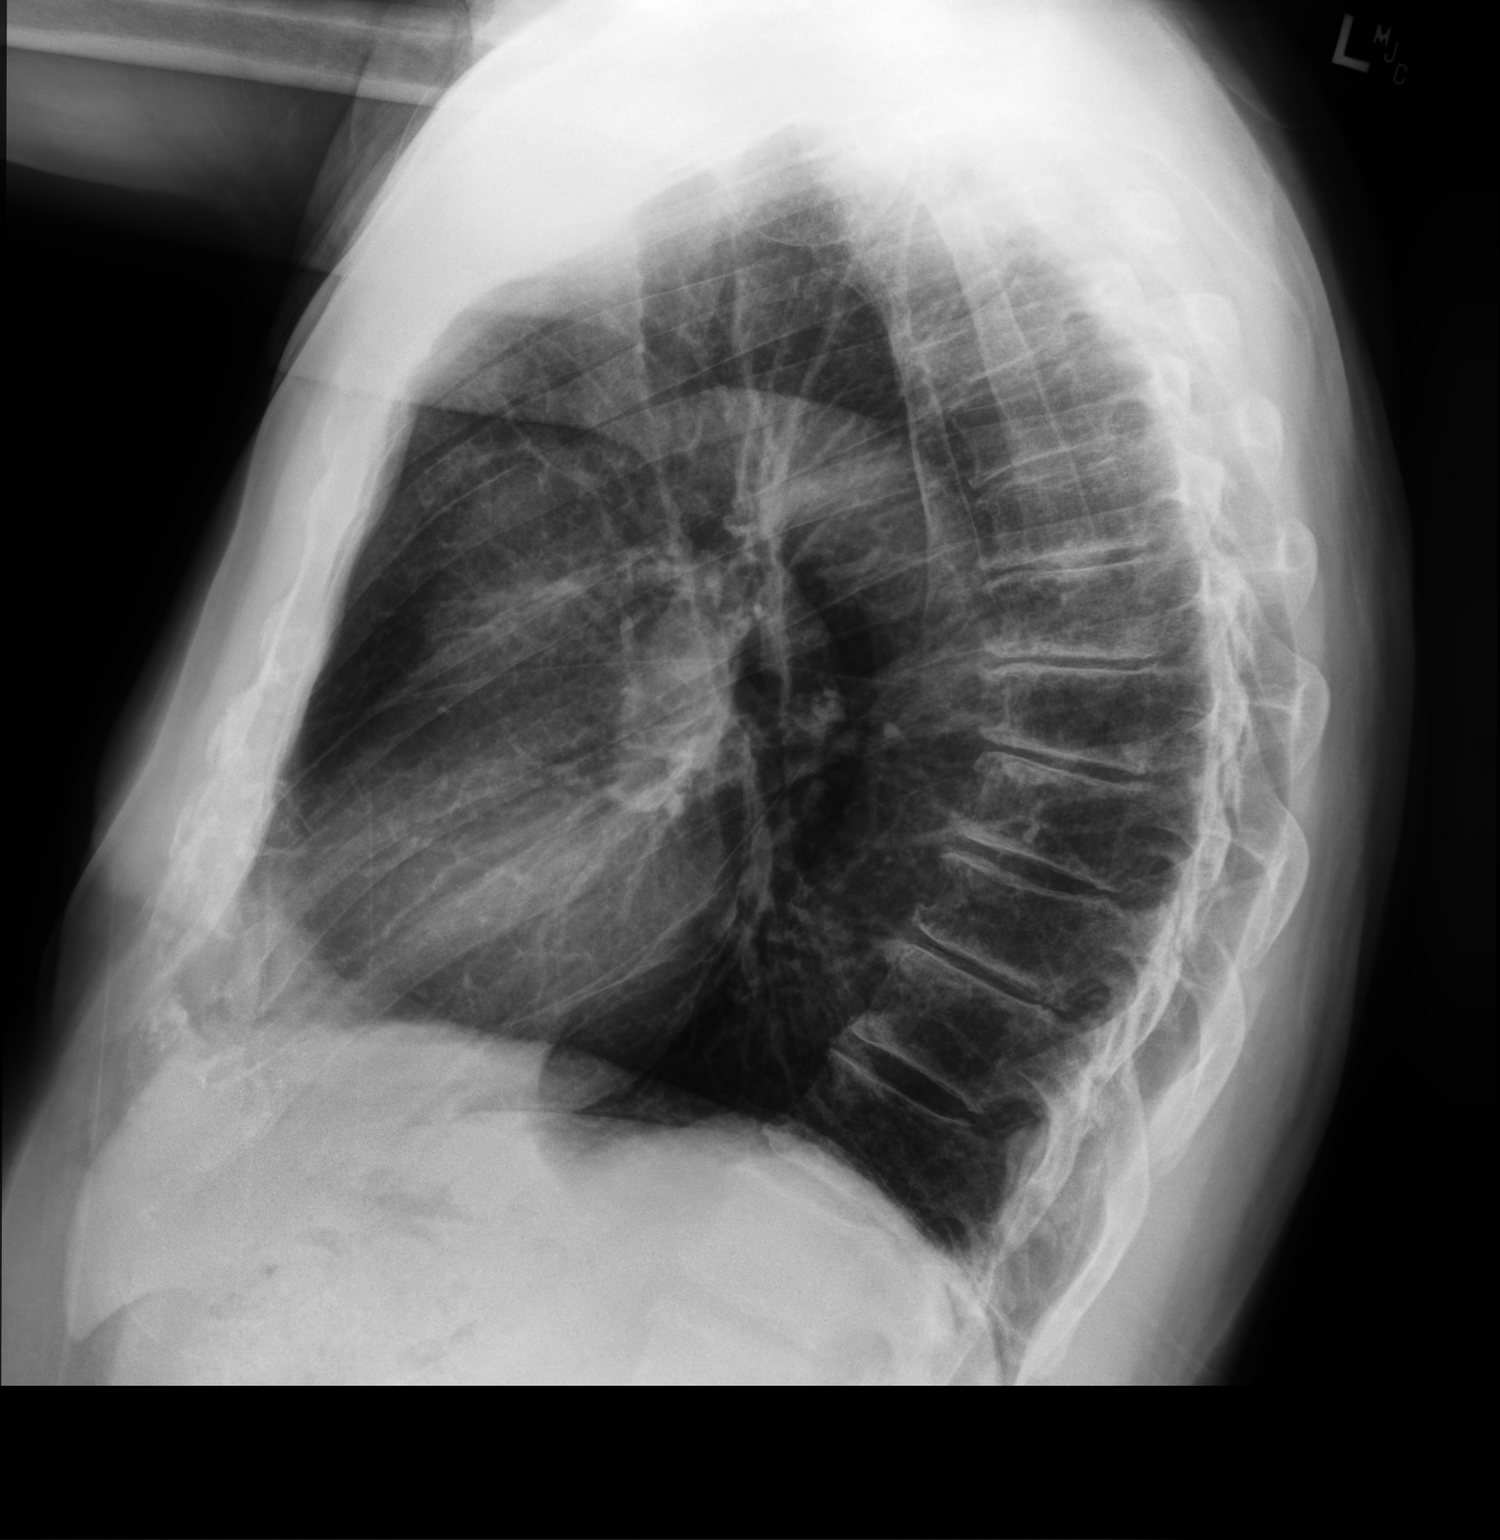

[2 of 2 positions shown; findings below may reference images not displayed]

FINDINGS: Normal heart size, mediastinal contours, and pulmonary vascularity.

Atherosclerotic calcification aorta.

Emphysematous and bronchitic changes consistent with COPD.

Minimal LEFT basilar scarring stable.

No acute infiltrate, pleural effusion, or pneumothorax.

No acute osseous findings.
IMPRESSION: Changes of COPD with LEFT basilar scarring.

No acute abnormalities.

## 2020-12-04 ENCOUNTER — Emergency Department (HOSPITAL_BASED_OUTPATIENT_CLINIC_OR_DEPARTMENT_OTHER)
Admission: EM | Admit: 2020-12-04 | Discharge: 2020-12-04 | Disposition: A | Discharge status: Home / Self Care | Payer: Medicare Other | Attending: Emergency Medicine | Admitting: Emergency Medicine

## 2020-12-04 ENCOUNTER — Other Ambulatory Visit: Payer: Self-pay

## 2020-12-04 DIAGNOSIS — R42 Dizziness and giddiness: Secondary | ICD-10-CM

## 2020-12-04 DIAGNOSIS — Z7982 Long term (current) use of aspirin: Secondary | ICD-10-CM | POA: Insufficient documentation

## 2020-12-04 DIAGNOSIS — Z7951 Long term (current) use of inhaled steroids: Secondary | ICD-10-CM | POA: Insufficient documentation

## 2020-12-04 DIAGNOSIS — R600 Localized edema: Secondary | ICD-10-CM | POA: Diagnosis not present

## 2020-12-04 DIAGNOSIS — J449 Chronic obstructive pulmonary disease, unspecified: Secondary | ICD-10-CM | POA: Insufficient documentation

## 2020-12-04 DIAGNOSIS — Z87891 Personal history of nicotine dependence: Secondary | ICD-10-CM | POA: Diagnosis not present

## 2020-12-04 DIAGNOSIS — Z7901 Long term (current) use of anticoagulants: Secondary | ICD-10-CM | POA: Insufficient documentation

## 2020-12-04 LAB — CBC
HCT: 39.9 % (ref 39.0–52.0)
Hemoglobin: 12.8 g/dL — ABNORMAL LOW (ref 13.0–17.0)
MCH: 29.4 pg (ref 26.0–34.0)
MCHC: 32.1 g/dL (ref 30.0–36.0)
MCV: 91.5 fL (ref 80.0–100.0)
Platelets: 254 10*3/uL (ref 150–400)
RBC: 4.36 MIL/uL (ref 4.22–5.81)
RDW: 14.1 % (ref 11.5–15.5)
WBC: 7.5 10*3/uL (ref 4.0–10.5)
nRBC: 0 % (ref 0.0–0.2)

## 2020-12-04 LAB — BASIC METABOLIC PANEL
Anion gap: 8 (ref 5–15)
BUN: 18 mg/dL (ref 8–23)
CO2: 25 mmol/L (ref 22–32)
Calcium: 9.3 mg/dL (ref 8.9–10.3)
Chloride: 101 mmol/L (ref 98–111)
Creatinine, Ser: 1.16 mg/dL (ref 0.61–1.24)
GFR, Estimated: >60 mL/min (ref >60)
Glucose, Bld: 135 mg/dL — ABNORMAL HIGH (ref 70–99)
Potassium: 4.2 mmol/L (ref 3.5–5.1)
Sodium: 134 mmol/L — ABNORMAL LOW (ref 135–145)

## 2020-12-04 LAB — URINALYSIS, ROUTINE W REFLEX MICROSCOPIC
Bilirubin Urine: NEGATIVE
Glucose, UA: NEGATIVE mg/dL
Hgb urine dipstick: NEGATIVE
Ketones, ur: NEGATIVE mg/dL
Leukocytes,Ua: NEGATIVE
Nitrite: NEGATIVE
Protein, ur: NEGATIVE mg/dL
Specific Gravity, Urine: 1.02 (ref 1.005–1.030)
pH: 6 (ref 5.0–8.0)

## 2020-12-04 MED ORDER — MECLIZINE HCL 25 MG PO TABS
25.0000 mg | ORAL_TABLET | Freq: Once | ORAL | Status: AC
  Administered 2020-12-04: 25 mg via ORAL
  Filled 2020-12-04: qty 1

## 2020-12-04 MED ORDER — SODIUM CHLORIDE 0.9 % IV BOLUS
1000.0000 mL | Freq: Once | INTRAVENOUS | Status: AC
  Administered 2020-12-04: 1000 mL via INTRAVENOUS

## 2020-12-04 MED ORDER — MECLIZINE HCL 25 MG PO TABS: 12.5000 mg | ORAL_TABLET | Freq: Three times a day (TID) | ORAL | 0 refills | Status: AC | PRN

## 2020-12-04 NOTE — ED Triage Notes (Signed)
Pt presents to ED POV. Pt c/o intermittent dizziness. Started since d/c from hosp for knee surgery. Pt reports he stopped taking home meds 3 days ago d/t thinking that was making him dizzy.

## 2020-12-04 NOTE — ED Provider Notes (Signed)
85 year old male with concern for dizziness.  Ongoing for past month.  Basic labs checked, UA pending.  If patient remains well-appearing and symptoms improving, anticipate discharge.  Received signout from Dr. Adela Lank.  Reassessed patient, no ongoing dizziness, vital stable, will discharge home with plan to follow-up with PCP.   Milagros Loll, MD 12/04/20 2050

## 2020-12-04 NOTE — ED Provider Notes (Signed)
[facility] EMERGENCY DEPARTMENT Provider Note   CSN: [id number] Arrival date & time: 12/04/20  1323     History Chief Complaint  Patient presents with   Dizziness    Willie Harris is a 85 y.o. male.  85 yo M with a chief complaint of dizziness.  This been going on for at least a month.  He has trouble describing exactly of what it means to be dizzy.  He does not know what makes it better or worse.  He thought it was related to his medications and so he stopped all of them about 2 or 3 days ago but without significant improvement.  He has felt unsteady at times.  He was recently in the hospital at the [facility] for a right knee replacement.  He spent 2 weeks in the hospital and then was discharged home.  Started having some continued right knee pain.  Denies fevers denies redness to the knee denies trauma.  Denies headache or neck pain denies chest pain or shortness of breath.  Has had some abdominal burning off and on that improves with eating.  He did not like the food when he was in the hospital and said he did not really eat and lost about 25 pounds.  He also does not feel like he drinks enough.  He does think that he has been dizzy previously and has had medication for dizziness please not sure what that is.  The history is provided by the patient.  Dizziness Quality:  Unable to specify Severity:  Mild Onset quality:  Gradual Duration:  1 month Timing:  Constant Progression:  Worsening Chronicity:  New Relieved by:  Nothing Worsened by:  Nothing Ineffective treatments:  None tried Associated symptoms: no chest pain, no diarrhea, no headaches, no palpitations, no shortness of breath and no vomiting       Past Medical History:  Diagnosis Date   Anxiety    Cataract    bilateral   COPD (chronic obstructive pulmonary disease) (HCC)     There are no problems to display for this patient.   No past surgical history on file.     No family history on file.  Social  History   Tobacco Use   Smoking status: Former    Types: Cigarettes    Quit date: 05/09/2013    Years since quitting: 7.5   Smokeless tobacco: Never  Substance Use Topics   Alcohol use: Yes    Comment: occ   Drug use: Yes    Types: Marijuana    Home Medications Prior to Admission medications   Medication Sig Start Date End Date Taking? Authorizing Provider  meclizine (ANTIVERT) 25 MG tablet Take 0.5 tablets (12.5 mg total) by mouth 3 (three) times daily as needed for dizziness. 12/04/20  Yes Milagros Loll, MD  albuterol (PROVENTIL HFA;VENTOLIN HFA) 108 (90 Base) MCG/ACT inhaler Inhale 1-2 puffs into the lungs every 6 (six) hours as needed for wheezing or shortness of breath. 03/23/18   Alene Mires, NP  amLODipine (NORVASC) 2.5 MG tablet Take 2.5 mg by mouth daily.    [provider]  apixaban (ELIQUIS) 5 MG TABS tablet Take 5 mg by mouth 2 (two) times daily.    [provider]  aspirin EC 81 MG tablet Take 81 mg by mouth daily.    [provider]  budesonide-formoterol (SYMBICORT) 160-4.5 MCG/ACT inhaler Inhale 2 puffs into the lungs 2 (two) times daily.    [provider]  clonazePAM (KLONOPIN) 1 MG tablet Take 0.5 tablets (0.5 mg total) by mouth 2 (two) times daily. 02/04/14   Arthor Captain, PA-C  dronabinol (MARINOL) 5 MG capsule Take 1 capsule (5 mg total) by mouth daily before lunch. 01/12/17   Arnaldo Natal, MD  hydrOXYzine (ATARAX/VISTARIL) 10 MG tablet Take 10 mg by mouth at bedtime as needed for anxiety (for sleep).    [provider]  metFORMIN (GLUCOPHAGE) 500 MG tablet Take by mouth 2 (two) times daily with a meal.    [provider]  pravastatin (PRAVACHOL) 40 MG tablet Take 40 mg by mouth daily.    [provider]    Allergies    Sulfa antibiotics  Review of Systems   Review of Systems  Constitutional:  Negative for chills and fever.  HENT:  Negative for congestion and facial swelling.    Eyes:  Negative for discharge and visual disturbance.  Respiratory:  Negative for shortness of breath.   Cardiovascular:  Negative for chest pain and palpitations.  Gastrointestinal:  Negative for abdominal pain, diarrhea and vomiting.  Musculoskeletal:  Negative for arthralgias and myalgias.  Skin:  Negative for color change and rash.  Neurological:  Positive for dizziness. Negative for tremors, syncope and headaches.  Psychiatric/Behavioral:  Negative for confusion and dysphoric mood.    Physical Exam Updated Vital Signs BP (!) 165/88   Pulse 71   Temp 98.6 F (37 C) (Oral)   Resp 17   Ht 5\' 10"  (1.778 m)   Wt 74.8 kg   SpO2 99%   BMI 23.68 kg/m   Physical Exam Vitals and nursing note reviewed.  Constitutional:      Appearance: He is well-developed.  HENT:     Head: Normocephalic and atraumatic.  Eyes:     Pupils: Pupils are equal, round, and reactive to light.  Neck:     Vascular: No JVD.  Cardiovascular:     Rate and Rhythm: Normal rate and regular rhythm.     Heart sounds: No murmur heard.   No friction rub. No gallop.  Pulmonary:     Effort: No respiratory distress.     Breath sounds: No wheezing.  Abdominal:     General: There is no distension.     Tenderness: There is no abdominal tenderness. There is no guarding or rebound.  Musculoskeletal:        General: Normal range of motion.     Cervical back: Normal range of motion and neck supple.     Comments: Right knee with trace edema.  Able to range without significant issue.  Not hot , no erythema.  Skin:    Coloration: Skin is not pale.     Findings: No rash.  Neurological:     Mental Status: He is alert and oriented to person, place, and time.     GCS: GCS eye subscore is 4. GCS verbal subscore is 5. GCS motor subscore is 6.     Cranial Nerves: Cranial nerves are intact.     Sensory: Sensation is intact.     Motor: Motor function is intact.     Coordination: Coordination is intact.     Comments: Walks  with a painful gait with a cane which is his new baseline.  Psychiatric:        Behavior: Behavior normal.    ED Results / Procedures / Treatments   Labs (all labs ordered are listed, but only abnormal results are displayed) Labs Reviewed  BASIC METABOLIC  PANEL - Abnormal; Notable for the following components:      Result Value   Sodium 134 (*)    Glucose, Bld 135 (*)    All other components within normal limits  CBC - Abnormal; Notable for the following components:   Hemoglobin 12.8 (*)    All other components within normal limits  URINALYSIS, ROUTINE W REFLEX MICROSCOPIC  CBG MONITORING, ED    EKG EKG Interpretation  Date/Time:  Saturday December 04 2020 13:43:06 EDT Ventricular Rate:  76 PR Interval:  167 QRS Duration: 85 QT Interval:  378 QTC Calculation: 425 R Axis:   -17 Text Interpretation: Sinus or ectopic atrial rhythm Borderline left axis deviation no wpw prolonged qt or brugada No significant change since last tracing Confirmed by Melene Plan [id number]) on 12/04/2020 2:16:35 PM  Radiology No results found.  Procedures Procedures   Medications Ordered in ED Medications  sodium chloride 0.9 % bolus 1,000 mL ( Intravenous Stopped 12/04/20 1546)  meclizine (ANTIVERT) tablet 25 mg (25 mg Oral Given 12/04/20 1408)    ED Course  I have reviewed the triage vital signs and the nursing notes.  Pertinent labs & imaging results that were available during my care of the patient were reviewed by me and considered in my medical decision making (see chart for details).    MDM Rules/Calculators/A&P                           85 yo M with a chief complaints of dizziness.  He has trouble describing this.  Going on for about a month after having a knee replacement performed.  It does sound like he is probably dehydrated based on his history.  Has been eating and drinking quite significantly less since the procedure.  We will give a bolus of IV fluids blood work reassess.  CBC  without acute anemia, no significant lyte abnormality.  Awaiting UA.  Signed out to Dr Stevie Kern, please see his note for further details of care in the ED.  The patients results and plan were reviewed and discussed.   Any x-rays performed were independently reviewed by myself.   Differential diagnosis were considered with the presenting HPI.  Medications  sodium chloride 0.9 % bolus 1,000 mL ( Intravenous Stopped 12/04/20 1546)  meclizine (ANTIVERT) tablet 25 mg (25 mg Oral Given 12/04/20 1408)    Vitals:   12/04/20 1548 12/04/20 1600 12/04/20 1602 12/04/20 1615  BP: (!) 159/88   (!) 165/88  Pulse: 73 72 69 71  Resp: 16 18 16 17   Temp:      TempSrc:      SpO2: 99%  99% 99%  Weight:      Height:        Final diagnoses:  Dizziness     Final Clinical Impression(s) / ED Diagnoses Final diagnoses:  Dizziness    Rx / DC Orders ED Discharge Orders          Ordered    meclizine (ANTIVERT) 25 MG tablet  3 times daily PRN        12/04/20 1619             12/06/20, DO 12/06/20 0730

## 2020-12-04 NOTE — Discharge Instructions (Addendum)
Please follow-up with your primary doctor early next week for recheck.  Take meclizine as needed for dizziness/lightheadedness.  If you have any episodes of passing out, difficulty walking, worsening dizziness or room spinning sensation, vomiting, numbness, weakness, speech or vision change, come back to ER immediately for reassessment.

## 2021-01-20 ENCOUNTER — Other Ambulatory Visit: Payer: Self-pay | Admitting: Internal Medicine

## 2021-01-21 LAB — HEPATIC FUNCTION PANEL
AG Ratio: 1.4 (calc) (ref 1.0–2.5)
ALT: 11 U/L (ref 9–46)
AST: 17 U/L (ref 10–35)
Albumin: 4.2 g/dL (ref 3.6–5.1)
Alkaline phosphatase (APISO): 64 U/L (ref 35–144)
Bilirubin, Direct: 0.2 mg/dL (ref 0.0–0.2)
Globulin: 3.1 g/dL (calc) (ref 1.9–3.7)
Indirect Bilirubin: 0.5 mg/dL (calc) (ref 0.2–1.2)
Total Bilirubin: 0.7 mg/dL (ref 0.2–1.2)
Total Protein: 7.3 g/dL (ref 6.1–8.1)

## 2021-01-21 LAB — LIPID PANEL
Cholesterol: 138 mg/dL (ref <200)
HDL: 51 mg/dL (ref >=40)
LDL Cholesterol (Calc): 66 mg/dL (calc)
Non-HDL Cholesterol (Calc): 87 mg/dL (calc) (ref <130)
Total CHOL/HDL Ratio: 2.7 (calc) (ref <5.0)
Triglycerides: 126 mg/dL (ref <150)

## 2021-01-21 LAB — EXTRA LAV TOP TUBE

## 2022-05-13 ENCOUNTER — Emergency Department (HOSPITAL_BASED_OUTPATIENT_CLINIC_OR_DEPARTMENT_OTHER): Payer: 59

## 2022-05-13 ENCOUNTER — Other Ambulatory Visit: Payer: Self-pay

## 2022-05-13 ENCOUNTER — Encounter (HOSPITAL_COMMUNITY): Payer: Self-pay

## 2022-05-13 ENCOUNTER — Observation Stay (HOSPITAL_BASED_OUTPATIENT_CLINIC_OR_DEPARTMENT_OTHER)
Admission: EM | Admit: 2022-05-13 | Discharge: 2022-05-14 | Disposition: A | Discharge status: Home / Self Care | Payer: 59 | Attending: Family Medicine | Admitting: Family Medicine

## 2022-05-13 DIAGNOSIS — R0602 Shortness of breath: Secondary | ICD-10-CM | POA: Diagnosis present

## 2022-05-13 DIAGNOSIS — E11649 Type 2 diabetes mellitus with hypoglycemia without coma: Secondary | ICD-10-CM | POA: Diagnosis not present

## 2022-05-13 DIAGNOSIS — Z7984 Long term (current) use of oral hypoglycemic drugs: Secondary | ICD-10-CM | POA: Insufficient documentation

## 2022-05-13 DIAGNOSIS — I110 Hypertensive heart disease with heart failure: Secondary | ICD-10-CM | POA: Insufficient documentation

## 2022-05-13 DIAGNOSIS — J441 Chronic obstructive pulmonary disease with (acute) exacerbation: Secondary | ICD-10-CM | POA: Diagnosis not present

## 2022-05-13 DIAGNOSIS — Z8673 Personal history of transient ischemic attack (TIA), and cerebral infarction without residual deficits: Secondary | ICD-10-CM | POA: Diagnosis not present

## 2022-05-13 DIAGNOSIS — Z96651 Presence of right artificial knee joint: Secondary | ICD-10-CM | POA: Insufficient documentation

## 2022-05-13 DIAGNOSIS — Z7982 Long term (current) use of aspirin: Secondary | ICD-10-CM | POA: Insufficient documentation

## 2022-05-13 DIAGNOSIS — I5021 Acute systolic (congestive) heart failure: Secondary | ICD-10-CM | POA: Insufficient documentation

## 2022-05-13 DIAGNOSIS — Z87891 Personal history of nicotine dependence: Secondary | ICD-10-CM | POA: Insufficient documentation

## 2022-05-13 DIAGNOSIS — Z79899 Other long term (current) drug therapy: Secondary | ICD-10-CM | POA: Insufficient documentation

## 2022-05-13 DIAGNOSIS — I10 Essential (primary) hypertension: Secondary | ICD-10-CM | POA: Insufficient documentation

## 2022-05-13 DIAGNOSIS — I480 Paroxysmal atrial fibrillation: Principal | ICD-10-CM | POA: Insufficient documentation

## 2022-05-13 DIAGNOSIS — Z7901 Long term (current) use of anticoagulants: Secondary | ICD-10-CM | POA: Insufficient documentation

## 2022-05-13 DIAGNOSIS — I4891 Unspecified atrial fibrillation: Secondary | ICD-10-CM

## 2022-05-13 HISTORY — DX: Type 2 diabetes mellitus without complications: E11.9

## 2022-05-13 HISTORY — DX: Transient cerebral ischemic attack, unspecified: G45.9

## 2022-05-13 HISTORY — DX: Obstructive sleep apnea (adult) (pediatric): G47.33

## 2022-05-13 HISTORY — DX: Hyperlipidemia, unspecified: E78.5

## 2022-05-13 HISTORY — DX: Gastro-esophageal reflux disease without esophagitis: K21.9

## 2022-05-13 HISTORY — DX: Unspecified atrial flutter: I48.92

## 2022-05-13 HISTORY — DX: Unspecified osteoarthritis, unspecified site: M19.90

## 2022-05-13 HISTORY — DX: Elevated prostate specific antigen (PSA): R97.20

## 2022-05-13 HISTORY — DX: Essential (primary) hypertension: I10

## 2022-05-13 LAB — BASIC METABOLIC PANEL
Anion gap: 9 (ref 5–15)
BUN: 17 mg/dL (ref 8–23)
CO2: 24 mmol/L (ref 22–32)
Calcium: 9.5 mg/dL (ref 8.9–10.3)
Chloride: 107 mmol/L (ref 98–111)
Creatinine, Ser: 1.2 mg/dL (ref 0.61–1.24)
GFR, Estimated: 60 mL/min — ABNORMAL LOW (ref >60)
Glucose, Bld: 171 mg/dL — ABNORMAL HIGH (ref 70–99)
Potassium: 4.1 mmol/L (ref 3.5–5.1)
Sodium: 140 mmol/L (ref 135–145)

## 2022-05-13 LAB — CBC
HCT: 44.6 % (ref 39.0–52.0)
Hemoglobin: 14.8 g/dL (ref 13.0–17.0)
MCH: 29.5 pg (ref 26.0–34.0)
MCHC: 33.2 g/dL (ref 30.0–36.0)
MCV: 88.8 fL (ref 80.0–100.0)
Platelets: 199 10*3/uL (ref 150–400)
RBC: 5.02 MIL/uL (ref 4.22–5.81)
RDW: 13.5 % (ref 11.5–15.5)
WBC: 7.8 10*3/uL (ref 4.0–10.5)
nRBC: 0 % (ref 0.0–0.2)

## 2022-05-13 LAB — GLUCOSE, CAPILLARY
Glucose-Capillary: 157 mg/dL — ABNORMAL HIGH (ref 70–99)
Glucose-Capillary: 171 mg/dL — ABNORMAL HIGH (ref 70–99)

## 2022-05-13 LAB — TROPONIN I (HIGH SENSITIVITY)
Troponin I (High Sensitivity): 3 ng/L (ref <18)
Troponin I (High Sensitivity): 3 ng/L (ref <18)

## 2022-05-13 LAB — MAGNESIUM: Magnesium: 2.1 mg/dL (ref 1.7–2.4)

## 2022-05-13 LAB — BRAIN NATRIURETIC PEPTIDE: B Natriuretic Peptide: 121.1 pg/mL — ABNORMAL HIGH (ref 0.0–100.0)

## 2022-05-13 LAB — D-DIMER, QUANTITATIVE: D-Dimer, Quant: <0.27 ug/mL-FEU (ref 0.00–0.50)

## 2022-05-13 MED ORDER — METOPROLOL TARTRATE 12.5 MG HALF TABLET
12.5000 mg | ORAL_TABLET | Freq: Two times a day (BID) | ORAL | Status: DC
  Administered 2022-05-13 – 2022-05-14 (×2): 12.5 mg via ORAL
  Filled 2022-05-13 (×2): qty 1

## 2022-05-13 MED ORDER — ACETAMINOPHEN 325 MG PO TABS: 650.0000 mg | ORAL_TABLET | ORAL | Status: DC | PRN

## 2022-05-13 MED ORDER — SODIUM CHLORIDE 0.9% FLUSH: 3.0000 mL | INTRAVENOUS | Status: DC | PRN

## 2022-05-13 MED ORDER — METFORMIN HCL 500 MG PO TABS
500.0000 mg | ORAL_TABLET | Freq: Two times a day (BID) | ORAL | Status: DC
  Administered 2022-05-13 – 2022-05-14 (×2): 500 mg via ORAL
  Filled 2022-05-13 (×2): qty 1

## 2022-05-13 MED ORDER — CLONAZEPAM 0.5 MG PO TABS
0.5000 mg | ORAL_TABLET | Freq: Two times a day (BID) | ORAL | Status: DC
  Administered 2022-05-13 – 2022-05-14 (×2): 0.5 mg via ORAL
  Filled 2022-05-13 (×2): qty 1

## 2022-05-13 MED ORDER — ALBUTEROL SULFATE HFA 108 (90 BASE) MCG/ACT IN AERS
2.0000 | INHALATION_SPRAY | RESPIRATORY_TRACT | Status: DC | PRN
  Filled 2022-05-13: qty 6.7

## 2022-05-13 MED ORDER — FLUTICASONE FUROATE-VILANTEROL 200-25 MCG/ACT IN AEPB
1.0000 | INHALATION_SPRAY | Freq: Every day | RESPIRATORY_TRACT | Status: DC
  Administered 2022-05-14: 1 via RESPIRATORY_TRACT
  Filled 2022-05-13: qty 28

## 2022-05-13 MED ORDER — HYDROXYZINE HCL 10 MG PO TABS: 10.0000 mg | ORAL_TABLET | Freq: Every evening | ORAL | Status: DC | PRN

## 2022-05-13 MED ORDER — ALBUTEROL SULFATE (2.5 MG/3ML) 0.083% IN NEBU: 2.5000 mg | INHALATION_SOLUTION | RESPIRATORY_TRACT | Status: DC | PRN

## 2022-05-13 MED ORDER — IPRATROPIUM BROMIDE 0.02 % IN SOLN: 0.5000 mg | RESPIRATORY_TRACT | Status: DC | PRN

## 2022-05-13 MED ORDER — DILTIAZEM HCL-DEXTROSE 125-5 MG/125ML-% IV SOLN (PREMIX)
5.0000 mg/h | INTRAVENOUS | Status: DC
  Administered 2022-05-13: 5 mg/h via INTRAVENOUS
  Filled 2022-05-13: qty 125

## 2022-05-13 MED ORDER — SODIUM CHLORIDE 0.9% FLUSH
3.0000 mL | Freq: Two times a day (BID) | INTRAVENOUS | Status: DC
  Administered 2022-05-13 – 2022-05-14 (×2): 3 mL via INTRAVENOUS

## 2022-05-13 MED ORDER — DRONABINOL 2.5 MG PO CAPS
5.0000 mg | ORAL_CAPSULE | Freq: Every day | ORAL | Status: DC
  Administered 2022-05-14: 5 mg via ORAL
  Filled 2022-05-13: qty 2
  Filled 2022-05-13: qty 1

## 2022-05-13 MED ORDER — SODIUM CHLORIDE 0.9 % IV SOLN: 250.0000 mL | INTRAVENOUS | Status: DC | PRN

## 2022-05-13 MED ORDER — PRAVASTATIN SODIUM 40 MG PO TABS: 40.0000 mg | ORAL_TABLET | Freq: Every day | ORAL | Status: DC

## 2022-05-13 MED ORDER — ASPIRIN 81 MG PO TBEC
81.0000 mg | DELAYED_RELEASE_TABLET | Freq: Every day | ORAL | Status: DC
  Administered 2022-05-14: 81 mg via ORAL
  Filled 2022-05-13: qty 1

## 2022-05-13 MED ORDER — ALUM & MAG HYDROXIDE-SIMETH 200-200-20 MG/5ML PO SUSP
30.0000 mL | Freq: Four times a day (QID) | ORAL | Status: DC | PRN
  Administered 2022-05-13: 30 mL via ORAL
  Filled 2022-05-13: qty 30

## 2022-05-13 MED ORDER — INSULIN ASPART 100 UNIT/ML IJ SOLN: 0.0000 [IU] | Freq: Three times a day (TID) | INTRAMUSCULAR | Status: DC

## 2022-05-13 MED ORDER — ONDANSETRON HCL 4 MG/2ML IJ SOLN: 4.0000 mg | Freq: Four times a day (QID) | INTRAMUSCULAR | Status: DC | PRN

## 2022-05-13 MED ORDER — FUROSEMIDE 10 MG/ML IJ SOLN
20.0000 mg | Freq: Once | INTRAMUSCULAR | Status: AC
  Administered 2022-05-13: 20 mg via INTRAVENOUS
  Filled 2022-05-13: qty 2

## 2022-05-13 MED ORDER — PRAVASTATIN SODIUM 40 MG PO TABS
40.0000 mg | ORAL_TABLET | Freq: Every day | ORAL | Status: DC
  Administered 2022-05-13: 40 mg via ORAL
  Filled 2022-05-13: qty 1

## 2022-05-13 MED ORDER — METOPROLOL TARTRATE 12.5 MG HALF TABLET: 12.5000 mg | ORAL_TABLET | Freq: Two times a day (BID) | ORAL | Status: DC

## 2022-05-13 MED ORDER — MECLIZINE HCL 25 MG PO TABS: 12.5000 mg | ORAL_TABLET | Freq: Three times a day (TID) | ORAL | Status: DC | PRN

## 2022-05-13 MED ORDER — METOPROLOL TARTRATE 5 MG/5ML IV SOLN: 2.5000 mg | INTRAVENOUS | Status: DC | PRN

## 2022-05-13 MED ORDER — DILTIAZEM LOAD VIA INFUSION
10.0000 mg | Freq: Once | INTRAVENOUS | Status: AC
  Administered 2022-05-13: 10 mg via INTRAVENOUS
  Filled 2022-05-13: qty 10

## 2022-05-13 MED ORDER — APIXABAN 5 MG PO TABS
5.0000 mg | ORAL_TABLET | Freq: Two times a day (BID) | ORAL | Status: DC
  Administered 2022-05-13 – 2022-05-14 (×2): 5 mg via ORAL
  Filled 2022-05-13: qty 2
  Filled 2022-05-13: qty 1

## 2022-05-13 NOTE — Progress Notes (Signed)
TR[facility] night cross cover note:   I was notified by RN that this patient, who is here with atrial fibrillation RVR, converted from A-fib to sinus rhythm around 2020, with ensuing heart rates in the 50s to 60s, and most recent systolic blood pressures in the 140s mmHg.  His prior diltiazem drip has been off since 1700.  Updated EKG obtained.     Babs Bertin, DO Hospitalist

## 2022-05-13 NOTE — H&P (Addendum)
History and Physical    SUFYAN MEIDINGER AJO:[id number] DOB: May 16, 1935 DOA: 05/13/2022  PCP: Patient, No Pcp Per (Confirm with patient/family/NH records and if not entered, this has to be entered at [facility] point of entry) Patient coming from: Home  I have personally briefly reviewed patient's old medical records in [place]  Chief Complaint: SOB  HPI: Willie Harris is a 87 y.o. male with medical history significant of PAF on Eliquis, bradycardia, HTN, HLD, TIA, COPD, OSA noncompliant with CPAP came with increasing SOB.  Patient started to feel SOB and light headedness this morning, with palpitations.  Patient reported has been having frequent episode of lightheadedness for several months.  Especially in the mornings, almost always around 3-6 AM, patient woke up and feeling lightheadedness for the first thing in the morning.  He has a continuous glucose monitor device on his arm and usually he will get alarmed of " low glucose" at that time and the prepared snacks and glucose solution at bedside.  But he reported " sugar shoot up to the sky" after snack or eating even a small meal.  In addition to the morning lightheadedness, he also experienced another episode of lightheadedness at the time which happens randomly, appears to related to activity.  The most significant episode happened last week while he was doing shopping in the grocery store, he had the feeling of " blackout almost" and had to sit on the chair for about 10 minutes.  He denies any palpitations chest pains or shortness of breath.  Although he does complain about decreased exertional tolerance recently, he can only walk about 5 minutes before starting to feel some shortness of breath.  Denies any leg swelling no chest pains.    He has been following with [facility], and has been on Eliquis for 5+ years, after he had new strokes.  He thinks he might have A-fib before but not 100% sure. About 2 months ago he went to see  his PCP at [facility], when he was told that episodes of dizziness may related to but not his medications, which was discontinued by PCP on that visit but patient reported no significant changes of dose dizziness episodes.  He denies any numbness weakness of the limbs and he reported his weight has been remained the same.  Patient was diagnosed with OSA 2-3 years ago and has been using CPAP only intermittently.  He also reported his COPD has been fairly controlled.  ED Course: At [facility] ED, patient was found to be in rapid A-fib with heart rate ranging from 120s-140s and Cardizem drip started in the ED.  X-ray shows mild pulmonary congestion.  Electrolytes K and magnesium within normal limits.  EKG showed A-fib.  Troponins negative x 2  Review of Systems: As per HPI otherwise 14 point review of systems negative.    Past Medical History:  Diagnosis Date   Anxiety    Cataract    bilateral   COPD (chronic obstructive pulmonary disease) ([place])     No past surgical history on file.   reports that he quit smoking about 9 years ago. He has never used smokeless tobacco. He reports current alcohol use. He reports current drug use. Drug: Marijuana.  Allergies  Allergen Reactions   Sulfa Antibiotics Other (See Comments)    Causing skin on hand to get real tight    No family history on file.   Prior to Admission medications   Medication Sig Start  Date End Date Taking? Authorizing Provider  albuterol (PROVENTIL HFA;VENTOLIN HFA) 108 (90 Base) MCG/ACT inhaler Inhale 1-2 puffs into the lungs every 6 (six) hours as needed for wheezing or shortness of breath. 03/23/18   Alene Mires, NP  amLODipine (NORVASC) 2.5 MG tablet Take 2.5 mg by mouth daily.    [provider]  apixaban (ELIQUIS) 5 MG TABS tablet Take 5 mg by mouth 2 (two) times daily.    [provider]  aspirin EC 81 MG tablet Take 81 mg by mouth daily.    [provider]   budesonide-formoterol (SYMBICORT) 160-4.5 MCG/ACT inhaler Inhale 2 puffs into the lungs 2 (two) times daily.    [provider]  clonazePAM (KLONOPIN) 1 MG tablet Take 0.5 tablets (0.5 mg total) by mouth 2 (two) times daily. 02/04/14   Arthor Captain, PA-C  dronabinol (MARINOL) 5 MG capsule Take 1 capsule (5 mg total) by mouth daily before lunch. 01/12/17   Arnaldo Natal, MD  hydrOXYzine (ATARAX/VISTARIL) 10 MG tablet Take 10 mg by mouth at bedtime as needed for anxiety (for sleep).    [provider]  meclizine (ANTIVERT) 25 MG tablet Take 0.5 tablets (12.5 mg total) by mouth 3 (three) times daily as needed for dizziness. 12/04/20   Milagros Loll, MD  metFORMIN (GLUCOPHAGE) 500 MG tablet Take by mouth 2 (two) times daily with a meal.    [provider]  pravastatin (PRAVACHOL) 40 MG tablet Take 40 mg by mouth daily.    [provider]    Physical Exam: Vitals:   05/13/22 1152 05/13/22 1200 05/13/22 1300 05/13/22 1545  BP:  118/86 117/83   Pulse: (!) 52 (!) 41 93   Resp: (!) 23 19 14    Temp:    97.6 F (36.4 C)  TempSrc:    Oral  SpO2: 97% 98% 98%   Weight:      Height:        Constitutional: NAD, calm, comfortable Vitals:   05/13/22 1152 05/13/22 1200 05/13/22 1300 05/13/22 1545  BP:  118/86 117/83   Pulse: (!) 52 (!) 41 93   Resp: (!) 23 19 14    Temp:    97.6 F (36.4 C)  TempSrc:    Oral  SpO2: 97% 98% 98%   Weight:      Height:       Eyes: PERRL, lids and conjunctivae normal ENMT: Mucous membranes are moist. Posterior pharynx clear of any exudate or lesions.Normal dentition.  Neck: normal, supple, no masses, no thyromegaly Respiratory: clear to auscultation bilaterally, no wheezing, fine crackles on bilateral bases, increasing breathing effort. No accessory muscle use.  Cardiovascular: Irregular heart rate, no murmurs / rubs / gallops. No extremity edema. 2+ pedal pulses. No carotid bruits.  Abdomen: no tenderness, no masses  palpated. No hepatosplenomegaly. Bowel sounds positive.  Musculoskeletal: no clubbing / cyanosis. No joint deformity upper and lower extremities. Good ROM, no contractures. Normal muscle tone.  Skin: no rashes, lesions, ulcers. No induration Neurologic: CN 2-12 grossly intact. Sensation intact, DTR normal. Strength 5/5 in all 4.  Psychiatric: Normal judgment and insight. Alert and oriented x 3. Normal mood.     Labs on Admission: I have personally reviewed following labs and imaging studies  CBC: Recent Labs  Lab 05/13/22 1157  WBC 7.8  HGB 14.8  HCT 44.6  MCV 88.8  PLT 199   Basic Metabolic Panel: Recent Labs  Lab 05/13/22 1157 05/13/22 1416  NA 140  --  K 4.1  --   CL 107  --   CO2 24  --   GLUCOSE 171*  --   BUN 17  --   CREATININE 1.20  --   CALCIUM 9.5  --   MG  --  2.1   GFR: Estimated Creatinine Clearance: 47.3 mL/min (by C-G formula based on SCr of 1.2 mg/dL). Liver Function Tests: No results for input(s): "AST", "ALT", "ALKPHOS", "BILITOT", "PROT", "ALBUMIN" in the last 168 hours. No results for input(s): "LIPASE", "AMYLASE" in the last 168 hours. No results for input(s): "AMMONIA" in the last 168 hours. Coagulation Profile: No results for input(s): "INR", "PROTIME" in the last 168 hours. Cardiac Enzymes: No results for input(s): "CKTOTAL", "CKMB", "CKMBINDEX", "TROPONINI" in the last 168 hours. BNP (last 3 results) No results for input(s): "PROBNP" in the last 8760 hours. HbA1C: No results for input(s): "HGBA1C" in the last 72 hours. CBG: No results for input(s): "GLUCAP" in the last 168 hours. Lipid Profile: No results for input(s): "CHOL", "HDL", "LDLCALC", "TRIG", "CHOLHDL", "LDLDIRECT" in the last 72 hours. Thyroid Function Tests: No results for input(s): "TSH", "T4TOTAL", "FREET4", "T3FREE", "THYROIDAB" in the last 72 hours. Anemia Panel: No results for input(s): "VITAMINB12", "FOLATE", "FERRITIN", "TIBC", "IRON", "RETICCTPCT" in the last 72  hours. Urine analysis:    Component Value Date/Time   COLORURINE YELLOW 12/04/2020 1409   APPEARANCEUR CLEAR 12/04/2020 1409   LABSPEC 1.020 12/04/2020 1409   PHURINE 6.0 12/04/2020 1409   GLUCOSEU NEGATIVE 12/04/2020 1409   HGBUR NEGATIVE 12/04/2020 1409   BILIRUBINUR NEGATIVE 12/04/2020 1409   KETONESUR NEGATIVE 12/04/2020 1409   PROTEINUR NEGATIVE 12/04/2020 1409   NITRITE NEGATIVE 12/04/2020 1409   LEUKOCYTESUR NEGATIVE 12/04/2020 1409    Radiological Exams on Admission: DG Chest Port 1 View  Result Date: 05/13/2022 CLINICAL DATA:  Shortness of breath.  Tachycardia. EXAM: PORTABLE CHEST 1 VIEW COMPARISON:  11/12/2018 FINDINGS: Normal sized heart. Clear lungs with normal vascularity. Thoracic spine and bilateral shoulder degenerative changes. Tortuous and partially calcified thoracic aorta. IMPRESSION: No acute abnormality. Electronically Signed   By: Beckie Salts M.D.   On: 05/13/2022 12:24    EKG: Independently reviewed.  A-fib with RVR, no acute ST changes.  Assessment/Plan Principal Problem:   A-fib (HCC) Active Problems:   HTN (hypertension)   COPD with acute exacerbation (HCC)  (please populate well all problems here in Problem List. (For example, if patient is on BP meds at home and you resume or decide to hold them, it is a problem that needs to be her. Same for CAD, COPD, HLD and so on)  A-fib with RVR with baseline PAF -Given there is also symptoms signs of CHF decompensation, discontinue Cardizem -Start low-dose of metoprolol will monitor his heart rate -As needed Lopressor for breakthrough heart rate -Continue Eliquis  Acute, probably on chronic CHF decompensation, probably diastolic -Likely secondary to uncontrolled A-fib -Manage heart rate as above -Clinically patient mild overload, will give 1 dose of Lasix 20 mg IV x 1 -Repeat chest x-ray to monitor symptoms tomorrow for further diuresis. -Echocardiogram  Near syncope's -Might related to episodic of  hypoglycemia at least for dose morning episodes of near syncope's. -However given the patient has a history of PAF and bradycardia before, cardiac etiology also need to be ruled out.  Continue telemonitoring x 24 hours, echocardiogram.  Consider outpatient cardiology follow-up for Zio patch/event monitor. -Orthostatic vitals  IIDM with hypoglycemia -Check A1c, if low consider measure insulin and C-peptide level -Continue metformin -Recommend  outpatient endocrinology follow-up for hypoglycemia and postprandial hyperglycemia  HTN -BP controlled, hold off amlodipine -Start low-dose of metoprolol as above  Hx of bradycardia -Tele monitoring -Now patient is on small dose of Metoprolol for fast afib.   COPD -Controlled, continue as needed breathing meds  OSA -Noncompliant with CPAP, check nocturnal pulse ox  DVT prophylaxis: Eliquis Code Status: Full code Family Communication: None at bedside Disposition Plan: Expect less than 2 midnight hospital stay Consults called: None Admission status: Tele observation   Lequita Halt MD [facility] Pager [phone]  05/13/2022, 5:04 PM

## 2022-05-13 NOTE — ED Triage Notes (Signed)
Patient arrives with complaints of shortness of breath and fatigue x1 day.  No vomiting/diarrhea or pain.

## 2022-05-13 NOTE — ED Notes (Signed)
No Albuterol MDI given at this time due to patient being in A-fib at this time

## 2022-05-13 NOTE — Progress Notes (Signed)
Pt placed on overnight pulse ox. 

## 2022-05-13 NOTE — ED Provider Notes (Signed)
[place] EMERGENCY DEPARTMENT AT [facility] Provider Note   CSN: [id number] Arrival date & time: 05/13/22  1052     History  Chief Complaint  Patient presents with   Shortness of Breath    Willie Harris is a 87 y.o. male.  Patient with a history of anxiety, COPD, Eliquis use of uncertain indication presenting with shortness of breath, weakness and palpitations since this morning.  States he felt well when he went to bed last night but woke up feeling weak and short of breath.  Does not believe this is his COPD.  He states he has had a dry cough.  He feels weak all over.  He has shortness of breath worse when he tries to walk around or go up stairs.  Denies any significant chest pain.  Denies any cough or fever.  Denies abdominal pain, nausea or vomiting.  No leg pain or leg swelling. Found to be in rapid A-fib on arrival.  He is not certain if he has a history of same.  He is not certain why he takes Eliquis.  Believes he has had a clot in the past.  Does admit to missing several doses of Eliquis per week.  The history is provided by the patient.  Shortness of Breath Associated symptoms: cough   Associated symptoms: no abdominal pain, no fever, no neck pain, no rash and no vomiting        Home Medications Prior to Admission medications   Medication Sig Start Date End Date Taking? Authorizing Provider  albuterol (PROVENTIL HFA;VENTOLIN HFA) 108 (90 Base) MCG/ACT inhaler Inhale 1-2 puffs into the lungs every 6 (six) hours as needed for wheezing or shortness of breath. 03/23/18   Alene Mires, NP  amLODipine (NORVASC) 2.5 MG tablet Take 2.5 mg by mouth daily.    [provider]  apixaban (ELIQUIS) 5 MG TABS tablet Take 5 mg by mouth 2 (two) times daily.    [provider]  aspirin EC 81 MG tablet Take 81 mg by mouth daily.    [provider]  budesonide-formoterol (SYMBICORT) 160-4.5 MCG/ACT inhaler Inhale 2 puffs into the lungs 2  (two) times daily.    [provider]  clonazePAM (KLONOPIN) 1 MG tablet Take 0.5 tablets (0.5 mg total) by mouth 2 (two) times daily. 02/04/14   Arthor Captain, PA-C  dronabinol (MARINOL) 5 MG capsule Take 1 capsule (5 mg total) by mouth daily before lunch. 01/12/17   Arnaldo Natal, MD  hydrOXYzine (ATARAX/VISTARIL) 10 MG tablet Take 10 mg by mouth at bedtime as needed for anxiety (for sleep).    [provider]  meclizine (ANTIVERT) 25 MG tablet Take 0.5 tablets (12.5 mg total) by mouth 3 (three) times daily as needed for dizziness. 12/04/20   Milagros Loll, MD  metFORMIN (GLUCOPHAGE) 500 MG tablet Take by mouth 2 (two) times daily with a meal.    [provider]  pravastatin (PRAVACHOL) 40 MG tablet Take 40 mg by mouth daily.    [provider]      Allergies    Sulfa antibiotics    Review of Systems   Review of Systems  Constitutional:  Negative for activity change, appetite change and fever.  HENT:  Positive for congestion and rhinorrhea.   Respiratory:  Positive for cough and shortness of breath. Negative for chest tightness.   Cardiovascular:  Positive for palpitations.  Gastrointestinal:  Negative for abdominal pain, nausea and vomiting.  Genitourinary:  Negative  for dysuria and hematuria.  Musculoskeletal:  Negative for arthralgias and neck pain.  Skin:  Negative for rash.  Neurological:  Positive for weakness and light-headedness. Negative for dizziness.   all other systems are negative except as noted in the HPI and PMH.    Physical Exam Updated Vital Signs BP 118/86   Pulse (!) 41   Temp 97.9 F (36.6 C) (Oral)   Resp 19   Ht 5\' 10"  (1.778 m)   Wt 74.8 kg   SpO2 98%   BMI 23.66 kg/m  Physical Exam Vitals and nursing note reviewed.  Constitutional:      General: He is not in acute distress.    Appearance: He is well-developed. He is not ill-appearing.     Comments: Dyspneic with conversation  HENT:     Head:  Normocephalic and atraumatic.     Mouth/Throat:     Pharynx: No oropharyngeal exudate.  Eyes:     Conjunctiva/sclera: Conjunctivae normal.     Pupils: Pupils are equal, round, and reactive to light.  Neck:     Comments: No meningismus. Cardiovascular:     Rate and Rhythm: Tachycardia present. Rhythm irregular.     Heart sounds: Normal heart sounds. No murmur heard. Pulmonary:     Effort: Pulmonary effort is normal. No respiratory distress.     Breath sounds: Normal breath sounds.     Comments: Diminished breath sounds bilaterally Chest:     Chest wall: No tenderness.  Abdominal:     Palpations: Abdomen is soft.     Tenderness: There is no abdominal tenderness. There is no guarding or rebound.  Musculoskeletal:        General: No tenderness. Normal range of motion.     Cervical back: Normal range of motion and neck supple.     Right lower leg: No edema.     Left lower leg: No edema.  Skin:    General: Skin is warm.  Neurological:     General: No focal deficit present.     Mental Status: He is alert and oriented to person, place, and time. Mental status is at baseline.     Cranial Nerves: No cranial nerve deficit.     Motor: No abnormal muscle tone.     Coordination: Coordination normal.     Comments: No ataxia on finger to nose bilaterally. No pronator drift. 5/5 strength throughout. CN 2-12 intact.Equal grip strength. Sensation intact.   Psychiatric:        Behavior: Behavior normal.     ED Results / Procedures / Treatments   Labs (all labs ordered are listed, but only abnormal results are displayed) Labs Reviewed  BASIC METABOLIC PANEL - Abnormal; Notable for the following components:      Result Value   Glucose, Bld 171 (*)    GFR, Estimated 60 (*)    All other components within normal limits  BRAIN NATRIURETIC PEPTIDE - Abnormal; Notable for the following components:   B Natriuretic Peptide 121.1 (*)    All other components within normal limits  CBC  D-DIMER,  QUANTITATIVE  MAGNESIUM  TROPONIN I (HIGH SENSITIVITY)  TROPONIN I (HIGH SENSITIVITY)    EKG EKG Interpretation  Date/Time:  Saturday May 13 2022 11:29:55 EST Ventricular Rate:  132 PR Interval:    QRS Duration: 80 QT Interval:  308 QTC Calculation: 456 R Axis:   -20 Text Interpretation: Atrial fibrillation with rapid ventricular response with premature ventricular or aberrantly conducted complexes Possible Anterior infarct ,  age undetermined Abnormal ECG When compared with ECG of 04-Dec-2020 13:43, PREVIOUS ECG IS PRESENT now atrial fibrillation with RVR Confirmed by Ezequiel Essex [phone]) on 05/13/2022 11:50:54 AM  Radiology DG Chest Port 1 View  Result Date: 05/13/2022 CLINICAL DATA:  Shortness of breath.  Tachycardia. EXAM: PORTABLE CHEST 1 VIEW COMPARISON:  11/12/2018 FINDINGS: Normal sized heart. Clear lungs with normal vascularity. Thoracic spine and bilateral shoulder degenerative changes. Tortuous and partially calcified thoracic aorta. IMPRESSION: No acute abnormality. Electronically Signed   By: Claudie Revering M.D.   On: 05/13/2022 12:24    Procedures .Critical Care  Performed by: Ezequiel Essex, MD Authorized by: Ezequiel Essex, MD   Critical care provider statement:    Critical care time (minutes):  45   Critical care time was exclusive of:  Separately billable procedures and treating other patients   Critical care was necessary to treat or prevent imminent or life-threatening deterioration of the following conditions: Afib with RVR.   Critical care was time spent personally by me on the following activities:  Development of treatment plan with patient or surrogate, discussions with consultants, evaluation of patient's response to treatment, examination of patient, ordering and review of laboratory studies, ordering and review of radiographic studies, ordering and performing treatments and interventions, pulse oximetry, re-evaluation of patient's condition, review  of old charts and blood draw for specimens   I assumed direction of critical care for this patient from another provider in my specialty: no     Care discussed with: admitting provider       Medications Ordered in ED Medications  albuterol (VENTOLIN HFA) 108 (90 Base) MCG/ACT inhaler 2 puff (has no administration in time range)  diltiazem (CARDIZEM) 1 mg/mL load via infusion 10 mg (has no administration in time range)    And  diltiazem (CARDIZEM) 125 mg in dextrose 5% 125 mL (1 mg/mL) infusion (has no administration in time range)    ED Course/ Medical Decision Making/ A&P                             Medical Decision Making Amount and/or Complexity of Data Reviewed Labs: ordered. Decision-making details documented in ED Course. Radiology: ordered and independent interpretation performed. Decision-making details documented in ED Course. ECG/medicine tests: ordered and independent interpretation performed. Decision-making details documented in ED Course.  Risk Prescription drug management. Decision regarding hospitalization.  Difficulty breathing with weakness and palpitations since this morning.  Found to be in A-fib with RVR.  Unknown if he has had this in the past.  He does take Eliquis and admits to missing doses frequently.  Not wheezing on exam.  Does not appear to be in COPD exacerbation.  Believe his shortness of breath is likely secondary to his palpitations.  Will initiate rate control with Cardizem.  Heart rate improving to the 110s. Patient denies any chest pain currently.  Workup was reassuring with negative troponin and negative D-dimer.  Low suspicion for ACS or pulmonary embolism.  Does have persistent atrial fibrillation and tachycardia but improving.  Suspect his shortness of breath is due to A-fib with RVR.  Unclear if this is new for him.  Has had limited compliance with his Eliquis and is not a cardioversion candidate.  Troponin Negative.  D-dimer negative.   Low suspicion for pulmonary embolism.  No evidence of CHF on chest x-ray.  Heart rate improving to the 110s.  Continue Cardizem drip.  Plan admit him for atrial  fibrillation of uncertain etiology.  Uncertain if this is new or not.  Admission given his symptomatology and persistent shortness of breath.  Discussed with Dr. Roosevelt Locks.        Final Clinical Impression(s) / ED Diagnoses Final diagnoses:  Atrial fibrillation with RVR [facility])    Rx / DC Orders ED Discharge Orders     None         Brylin Stopper, Annie Main, MD 05/13/22 1531

## 2022-05-14 ENCOUNTER — Observation Stay (HOSPITAL_BASED_OUTPATIENT_CLINIC_OR_DEPARTMENT_OTHER): Payer: 59

## 2022-05-14 ENCOUNTER — Encounter (HOSPITAL_COMMUNITY): Payer: Self-pay | Admitting: Internal Medicine

## 2022-05-14 DIAGNOSIS — I480 Paroxysmal atrial fibrillation: Secondary | ICD-10-CM | POA: Diagnosis not present

## 2022-05-14 DIAGNOSIS — I5031 Acute diastolic (congestive) heart failure: Secondary | ICD-10-CM | POA: Diagnosis not present

## 2022-05-14 LAB — ECHOCARDIOGRAM COMPLETE
Area-P 1/2: 3.27 cm2
Calc EF: 59.3 %
Height: 70 in
S' Lateral: 2.8 cm
Single Plane A2C EF: 61.7 %
Single Plane A4C EF: 56.9 %
Weight: 2768 oz

## 2022-05-14 LAB — BASIC METABOLIC PANEL
Anion gap: 9 (ref 5–15)
BUN: 16 mg/dL (ref 8–23)
CO2: 23 mmol/L (ref 22–32)
Calcium: 8.9 mg/dL (ref 8.9–10.3)
Chloride: 108 mmol/L (ref 98–111)
Creatinine, Ser: 1.24 mg/dL (ref 0.61–1.24)
GFR, Estimated: 57 mL/min — ABNORMAL LOW (ref >60)
Glucose, Bld: 127 mg/dL — ABNORMAL HIGH (ref 70–99)
Potassium: 3.9 mmol/L (ref 3.5–5.1)
Sodium: 140 mmol/L (ref 135–145)

## 2022-05-14 LAB — HEMOGLOBIN A1C
Hgb A1c MFr Bld: 7.2 % — ABNORMAL HIGH (ref 4.8–5.6)
Mean Plasma Glucose: 159.94 mg/dL

## 2022-05-14 LAB — TSH: TSH: 2.486 u[IU]/mL (ref 0.350–4.500)

## 2022-05-14 LAB — GLUCOSE, CAPILLARY
Glucose-Capillary: 180 mg/dL — ABNORMAL HIGH (ref 70–99)
Glucose-Capillary: 118 mg/dL — ABNORMAL HIGH (ref 70–99)

## 2022-05-14 MED ORDER — LIVING WELL WITH DIABETES BOOK
Freq: Once | Status: AC
  Filled 2022-05-14: qty 1

## 2022-05-14 MED ORDER — OFF THE BEAT BOOK
Freq: Once | Status: AC
  Filled 2022-05-14: qty 1

## 2022-05-14 MED ORDER — CHRONIC LUNG DISEASE PATIENT EDUCATION BOOK
Freq: Once | Status: DC
  Filled 2022-05-14: qty 1

## 2022-05-14 NOTE — Progress Notes (Signed)
While in pt's room at ~ 20:15, pt's HR dropped to 30's and pt then converted to sinus brady. Confirmed w/ EKG & on call [facility] provider, [place], DO updated via page.

## 2022-05-14 NOTE — Progress Notes (Signed)
I reviewed d/c instructions with patient. He had no further questions. Awaiting neighbor to come get him

## 2022-05-14 NOTE — Consult Note (Addendum)
Cardiology Consultation   Patient ID: Willie Harris MRN: [id number]; DOB: May 28, 1935  Admit date: 05/13/2022 Date of Consult: 05/14/2022  PCP:  Patient, No Pcp Per   [place] Providers Cardiologist:  None  here, gets care at the [facility], [place]  Patient Profile:   Willie Harris is a 87 y.o. male with a hx of DM, HTN, HTN, OA, OSA not on CPAP, atrial flutter on Eliquis, COPD and anxiety, who is being seen 05/14/2022 for the evaluation of tachy/brady at the request of Dr Florene Glen.  History of Present Illness:   Willie Harris has not seen Cardiology here before. He follows at the [place].  He came to the ER yesterday w/ SOB and fatigue. He was in rapid atrial fib.   He has had dizziness for a long time.  A little over a week ago, he had trouble remembering his phone number. He got light-headed and weak, almost fell but was able to make it to a chair.  He can now remember his phone number.   He checked his sugar when he got home and it was low.   His CBG drops into the 60s at times, but Willie Harris shoot up way too high after he eats.   The dizziness and low blood sugars have gone on all week.   He called his MD, was told to go to the ER, but did not, this was 10 days ago.  He finally listened to people talking to him and came to the ER. BP yesterday was 125/80, HR 78  The dizziness improved when he came to the ER.   He was unaware of the atrial fib, but may feel a little better since in SR.  Has appt w/ cardiologist at the [facility] next week.   IM MD told him he might be able to go home today.    Past Medical History:  Diagnosis Date   Anxiety    Atrial flutter ([place])    [facility]   Cataract    bilateral   COPD (chronic obstructive pulmonary disease) (HCC)    Diabetes mellitus type 2, noninsulin dependent (HCC)    Elevated PSA    GERD (gastroesophageal reflux disease)    HLD (hyperlipidemia)    HTN (hypertension)    OA (osteoarthritis)    OSA (obstructive  sleep apnea)    has CPAP, not currently using it   TIA (transient ischemic attack)     Past Surgical History:  Procedure Laterality Date   KNEE ARTHROPLASTY Right      Home Medications:  Prior to Admission medications   Medication Sig Start Date End Date Taking? Authorizing Provider  albuterol (PROVENTIL HFA;VENTOLIN HFA) 108 (90 Base) MCG/ACT inhaler Inhale 1-2 puffs into the lungs every 6 (six) hours as needed for wheezing or shortness of breath. 03/23/18   Jacqualine Mau, NP  amLODipine (NORVASC) 2.5 MG tablet Take 2.5 mg by mouth daily.    [provider]  apixaban (ELIQUIS) 5 MG TABS tablet Take 5 mg by mouth 2 (two) times daily.    [provider]  aspirin EC 81 MG tablet Take 81 mg by mouth daily.    [provider]  budesonide-formoterol (SYMBICORT) 160-4.5 MCG/ACT inhaler Inhale 2 puffs into the lungs 2 (two) times daily.    [provider]  clonazePAM (KLONOPIN) 1 MG tablet Take 0.5 tablets (0.5 mg total) by mouth 2 (two) times daily. 02/04/14   Margarita Mail, PA-C  dronabinol (MARINOL) 5  MG capsule Take 1 capsule (5 mg total) by mouth daily before lunch. 01/12/17   Harrie Foreman, MD  hydrOXYzine (ATARAX/VISTARIL) 10 MG tablet Take 10 mg by mouth at bedtime as needed for anxiety (for sleep).    [provider]  meclizine (ANTIVERT) 25 MG tablet Take 0.5 tablets (12.5 mg total) by mouth 3 (three) times daily as needed for dizziness. 12/04/20   Lucrezia Starch, MD  metFORMIN (GLUCOPHAGE) 500 MG tablet Take by mouth 2 (two) times daily with a meal.    [provider]  omeprazole (PRILOSEC) 40 MG capsule Take 40 mg by mouth 2 (two) times daily. 04/03/22   [provider]  pravastatin (PRAVACHOL) 40 MG tablet Take 40 mg by mouth daily.    [provider]    Inpatient Medications: Scheduled Meds:  apixaban  5 mg Oral BID   aspirin EC  81 mg Oral Daily   Chronic Lung Disease   Does not apply Once    clonazePAM  0.5 mg Oral BID   dronabinol  5 mg Oral QAC lunch   fluticasone furoate-vilanterol  1 puff Inhalation Daily   insulin aspart  0-9 Units Subcutaneous TID WC   pravastatin  40 mg Oral q1800   sodium chloride flush  3 mL Intravenous Q12H   Continuous Infusions:  sodium chloride     PRN Meds: sodium chloride, acetaminophen, albuterol, alum & mag hydroxide-simeth, hydrOXYzine, ipratropium, meclizine, metoprolol tartrate, ondansetron (ZOFRAN) IV, sodium chloride flush  Allergies:    Allergies  Allergen Reactions   Sulfa Antibiotics Other (See Comments)    Causing skin on hand to get real tight    Social History:   Social History   Socioeconomic History   Marital status: Divorced    Spouse name: Not on file   Number of children: Not on file   Years of education: Not on file   Highest education level: Not on file  Occupational History   Not on file  Tobacco Use   Smoking status: Former    Types: Cigarettes    Quit date: 05/09/2013    Years since quitting: 9.0   Smokeless tobacco: Never  Substance and Sexual Activity   Alcohol use: Yes    Comment: occ   Drug use: Yes    Types: Marijuana   Sexual activity: Not on file  Other Topics Concern   Not on file  Social History Narrative   Not on file   Social Determinants of Health   Financial Resource Strain: Not on file  Food Insecurity: Not on file  Transportation Needs: Not on file  Physical Activity: Not on file  Stress: Not on file  Social Connections: Not on file  Intimate Partner Violence: Not on file    Family History:   Family History  Problem Relation Age of Onset   Diabetes Mother    Diabetes Father    Diabetes Sister      ROS:  Please see the history of present illness.  All other ROS reviewed and negative.     Physical Exam/Data:   Vitals:   05/14/22 0538 05/14/22 0758 05/14/22 0916 05/14/22 1144  BP:   135/74 138/78  Pulse:  (!) 59  (!) 54  Resp:  16  18  Temp:  97.7 F (36.5 C)   97.8 F (36.6 C)  TempSrc:  Oral  Oral  SpO2:  98%  99%  Weight: 78.5 kg     Height:  Intake/Output Summary (Last 24 hours) at 05/14/2022 1500 Last data filed at 05/14/2022 0551 Gross per 24 hour  Intake 513.33 ml  Output 575 ml  Net -61.67 ml      05/14/2022    5:38 AM 05/13/2022    5:03 PM 05/13/2022   11:33 AM  Last 3 Weights  Weight (lbs) 173 lb 176 lb 12.8 oz 164 lb 14.5 oz  Weight (kg) 78.472 kg 80.196 kg 74.8 kg     Body mass index is 24.82 kg/m.  General:  Well nourished, well developed, in no acute distress HEENT: normal for age Neck: no JVD Vascular: No carotid bruits; Distal pulses 2+ bilaterally Cardiac:  normal S1, S2; RRR; no murmur  Lungs:  clear to auscultation bilaterally, no wheezing, rhonchi or rales  Abd: soft, nontender, no hepatomegaly  Ext: no edema Musculoskeletal:  No deformities, BUE and BLE strength normal and equal Skin: warm and dry  Neuro:  CNs 2-12 intact, no focal abnormalities noted Psych:  Normal affect   EKG:  The EKG was personally reviewed and demonstrates:   Initial ECG atrial fib, RVR, HR 131 Subsequent Sinus brady, HR 55 Telemetry:  Telemetry was personally reviewed and demonstrates:  Rapid atrial fib  >> SR  Relevant CV Studies:  ECHO: 05/14/2022  1. Low normal to mildly reduced LV function; EF 50.   2. Left ventricular ejection fraction, by estimation, is 50 to 55%. The left ventricle has low normal function. The left ventricle has no regional wall motion abnormalities. Left ventricular diastolic parameters were normal.   3. Right ventricular systolic function is normal. The right ventricular size is normal. There is normal pulmonary artery systolic pressure.   4. The mitral valve is normal in structure. Trivial mitral valve regurgitation. No evidence of mitral stenosis.   5. The aortic valve is tricuspid. Aortic valve regurgitation is not visualized. Aortic valve sclerosis is present, with no evidence of aortic valve  stenosis.   6. Aortic dilatation noted. There is borderline dilatation of the aortic root, measuring 38 mm.   7. The inferior vena cava is normal in size with greater than 50% respiratory variability, suggesting right atrial pressure of 3 mmHg.   Comparison(s): No prior Echocardiogram.   Laboratory Data:  High Sensitivity Troponin:   Recent Labs  Lab 05/13/22 1157 05/13/22 1416  TROPONINIHS 3 3     Chemistry Recent Labs  Lab 05/13/22 1157 05/13/22 1416 05/14/22 0133  NA 140  --  140  K 4.1  --  3.9  CL 107  --  108  CO2 24  --  23  GLUCOSE 171*  --  127*  BUN 17  --  16  CREATININE 1.20  --  1.24  CALCIUM 9.5  --  8.9  MG  --  2.1  --   GFRNONAA 60*  --  57*  ANIONGAP 9  --  9    No results for input(s): "PROT", "ALBUMIN", "AST", "ALT", "ALKPHOS", "BILITOT" in the last 168 hours. Lipids No results for input(s): "CHOL", "TRIG", "HDL", "LABVLDL", "LDLCALC", "CHOLHDL" in the last 168 hours.  Hematology Recent Labs  Lab 05/13/22 1157  WBC 7.8  RBC 5.02  HGB 14.8  HCT 44.6  MCV 88.8  MCH 29.5  MCHC 33.2  RDW 13.5  PLT 199   Thyroid  Recent Labs  Lab 05/14/22 0133  TSH 2.486    BNP Recent Labs  Lab 05/13/22 1203  BNP 121.1*    DDimer  Recent Labs  Lab 05/13/22 1157  DDIMER <0.27     Radiology/Studies:  ECHOCARDIOGRAM COMPLETE  Result Date: 05/14/2022    ECHOCARDIOGRAM REPORT   Patient Name:   Willie Harris Date of Exam: 05/14/2022 Medical Rec #:  [id number]        Height:       70.0 in Accession #:    [id number]       Weight:       173.0 lb Date of Birth:  [birth year]       BSA:          1.963 m Patient Age:    89 years         BP:           135/74 mmHg Patient Gender: M                HR:           56 bpm. Exam Location:  Inpatient Procedure: 2D Echo, Cardiac Doppler and Color Doppler Indications:    [id number] Acute diastolic (congestive) heart failure  History:        Patient has no prior history of Echocardiogram examinations.                 COPD.   Sonographer:    Phineas Douglas Referring Phys: [id number] [place]  1. Low normal to mildly reduced LV function; EF 50.  2. Left ventricular ejection fraction, by estimation, is 50 to 55%. The left ventricle has low normal function. The left ventricle has no regional wall motion abnormalities. Left ventricular diastolic parameters were normal.  3. Right ventricular systolic function is normal. The right ventricular size is normal. There is normal pulmonary artery systolic pressure.  4. The mitral valve is normal in structure. Trivial mitral valve regurgitation. No evidence of mitral stenosis.  5. The aortic valve is tricuspid. Aortic valve regurgitation is not visualized. Aortic valve sclerosis is present, with no evidence of aortic valve stenosis.  6. Aortic dilatation noted. There is borderline dilatation of the aortic root, measuring 38 mm.  7. The inferior vena cava is normal in size with greater than 50% respiratory variability, suggesting right atrial pressure of 3 mmHg. Comparison(s): No prior Echocardiogram. FINDINGS  Left Ventricle: Left ventricular ejection fraction, by estimation, is 50 to 55%. The left ventricle has low normal function. The left ventricle has no regional wall motion abnormalities. The left ventricular internal cavity size was normal in size. There is no left ventricular hypertrophy. Left ventricular diastolic parameters were normal. Right Ventricle: The right ventricular size is normal. Right ventricular systolic function is normal. There is normal pulmonary artery systolic pressure. The tricuspid regurgitant velocity is 2.73 m/s, and with an assumed right atrial pressure of 3 mmHg,  the estimated right ventricular systolic pressure is 0000000 mmHg. Left Atrium: Left atrial size was normal in size. Right Atrium: Right atrial size was normal in size. Pericardium: There is no evidence of pericardial effusion. Mitral Valve: The mitral valve is normal in structure. Trivial mitral  valve regurgitation. No evidence of mitral valve stenosis. Tricuspid Valve: The tricuspid valve is normal in structure. Tricuspid valve regurgitation is mild . No evidence of tricuspid stenosis. Aortic Valve: The aortic valve is tricuspid. Aortic valve regurgitation is not visualized. Aortic valve sclerosis is present, with no evidence of aortic valve stenosis. Pulmonic Valve: The pulmonic valve was normal in structure. Pulmonic valve regurgitation is not visualized. No evidence of pulmonic stenosis. Aorta: Aortic dilatation noted. There is  borderline dilatation of the aortic root, measuring 38 mm. Venous: The inferior vena cava is normal in size with greater than 50% respiratory variability, suggesting right atrial pressure of 3 mmHg. IAS/Shunts: No atrial level shunt detected by color flow Doppler. Additional Comments: Low normal to mildly reduced LV function; EF 50.  LEFT VENTRICLE PLAX 2D LVIDd:         4.10 cm      Diastology LVIDs:         2.80 cm      LV e' medial:    7.07 cm/s LV PW:         1.10 cm      LV E/e' medial:  11.4 LV IVS:        1.10 cm      LV e' lateral:   11.10 cm/s LVOT diam:     2.00 cm      LV E/e' lateral: 7.3 LV SV:         65 LV SV Index:   33 LVOT Area:     3.14 cm  LV Volumes (MOD) LV vol d, MOD A2C: 90.1 ml LV vol d, MOD A4C: 126.0 ml LV vol s, MOD A2C: 34.5 ml LV vol s, MOD A4C: 54.3 ml LV SV MOD A2C:     55.6 ml LV SV MOD A4C:     126.0 ml LV SV MOD BP:      65.1 ml RIGHT VENTRICLE             IVC RV Basal diam:  3.60 cm     IVC diam: 1.80 cm RV S prime:     11.00 cm/s TAPSE (M-mode): 1.9 cm LEFT ATRIUM             Index        RIGHT ATRIUM           Index LA diam:        3.60 cm 1.83 cm/m   RA Area:     15.90 cm LA Vol (A2C):   49.7 ml 25.32 ml/m  RA Volume:   39.60 ml  20.18 ml/m LA Vol (A4C):   59.8 ml 30.47 ml/m LA Biplane Vol: 59.7 ml 30.42 ml/m  AORTIC VALVE LVOT Vmax:   91.10 cm/s LVOT Vmean:  57.500 cm/s LVOT VTI:    0.208 m  AORTA Ao Root diam: 3.80 cm Ao Asc diam:   3.10 cm MITRAL VALVE               TRICUSPID VALVE MV Area (PHT): 3.27 cm    TR Peak grad:   29.8 mmHg MV Decel Time: 232 msec    TR Vmax:        273.00 cm/s MV E velocity: 80.90 cm/s MV A velocity: 63.60 cm/s  SHUNTS MV E/A ratio:  1.27        Systemic VTI:  0.21 m                            Systemic Diam: 2.00 cm Kirk Ruths MD Electronically signed by Kirk Ruths MD Signature Date/Time: 05/14/2022/12:31:51 PM    Final    DG Chest Port 1 View  Result Date: 05/13/2022 CLINICAL DATA:  Shortness of breath.  Tachycardia. EXAM: PORTABLE CHEST 1 VIEW COMPARISON:  11/12/2018 FINDINGS: Normal sized heart. Clear lungs with normal vascularity. Thoracic spine and bilateral shoulder degenerative changes. Tortuous and partially calcified thoracic aorta. IMPRESSION: No acute  abnormality. Electronically Signed   By: Beckie Salts M.D.   On: 05/13/2022 12:24     Assessment and Plan:   PAF - s/p spontaneous conversion to SR - pt not aware of AF, but may feel a little better in SR.  - says is compliant w/ meds, including Xarelto - he may need to wear a monitor, but could wait and let [facility] decide that - HR 50s at times, no sustained bradycardia  - starting in 2015, multiple ER visits for weakness and dizziness +/- SOB and cough.  - it is unclear if Afib caused the symptoms - no further inpatient cardiac workup indicated, follow up as scheduled w/ primary Cardiologist at the [facility].    Risk Assessment/Risk Scores:        CHA2DS2-VASc Score = 6   This indicates a 9.7% annual risk of stroke. The patient's score is based upon: CHF History: 0 HTN History: 1 Diabetes History: 1 Stroke History: 2 Vascular Disease History: 0 Age Score: 2 Gender Score: 0   [facility] Lashannon Bresnan sign off.   Medication Recommendations:  continue home meds Other recommendations (labs, testing, etc):  none Follow up as an outpatient:  as scheduled next week  For questions or updates, please contact [facility] Please consult [email or web address] for contact info under    Signed, Theodore Demark, PA-C  05/14/2022 3:00 PM   I have seen and examined this patient with Theodore Demark.  Agree with above, note added to reflect my findings.  He has a past medical history as above.  He has been having dizziness.  He states that he was at the store and felt dizzy.  He was able to make it to a chair.  He had been having dizziness for approximately a week.  He checked his blood sugar and it was low.  He states his blood sugar does drop at times which is improved with eating.  He presented in the emergency room and was noted to be in rapid atrial fibrillation.  He is since converted to sinus rhythm.  He is no longer feeling dizzy or fatigued.  GEN: Well nourished, well developed, in no acute distress  HEENT: normal  Neck: no JVD, carotid bruits, or masses Cardiac: RRR; no murmurs, rubs, or gallops,no edema  Respiratory:  clear to auscultation bilaterally, normal work of breathing GI: soft, nontender, nondistended, + BS MS: no deformity or atrophy  Skin: warm and dry Neuro:  Strength and sensation are intact Psych: euthymic mood, full affect   Dizziness: Likely multifactorial.  Patient states that he has vertigo, and also feels dizzy when his blood sugar drops.  Plan per primary team. Paroxysmal atrial fibrillation: Continue Xarelto.  Patient states that he is compliant.  He would potentially benefit from antiarrhythmic medications, amiodarone would be a reasonable option.  He does have follow-up scheduled with his primary cardiologist this week.  I have instructed him to discuss with his primary cardiologist which antiarrhythmic medication would be reasonable.    Cardiology to sign off.  As above he has follow-up with his primary cardiologist.  Ryelee Albee M. Lorieann Argueta MD 05/14/2022 3:09 PM

## 2022-05-14 NOTE — Progress Notes (Signed)
  Echocardiogram 2D Echocardiogram has been performed.  Willie Harris 05/14/2022, 11:26 AM

## 2022-05-14 NOTE — Progress Notes (Signed)
Pt d/c'd with charge nurse to private vehicle in wheelchair. Stable condition

## 2022-05-14 NOTE — Discharge Summary (Addendum)
Physician Discharge Summary  Willie Harris TDV:[id number] DOB: 1935/07/18 DOA: 05/13/2022  PCP: Patient, No Pcp Per  Admit date: 05/13/2022 Discharge date: 05/14/2022  Time spent: 40 minutes  Recommendations for Outpatient Follow-up:  Follow outpatient CBC/CMP  Follow with cardiology outpatient - consider antiarrhythmic, consider cardiac monitor   Concern for hypoglycemia, as below - unclear significance of this - he's being woken up by CGM with BG's in 60's - I've instructed him to stop his metformin as an outpatient, though this alone is Willie Harris rare cause of hypoglycemia.  If continued issues, would need additional workup.  Workup presyncope outpatient - consider cardiac related (bradycardia?) or related to hypoglycemia?  Discharge Diagnoses:  Principal Problem:   Willie Harris-fib [facility]) Active Problems:   HTN (hypertension)   COPD with acute exacerbation ([place])   Discharge Condition: stable  Diet recommendation: heart healthy, diabetic  Filed Weights   05/13/22 1133 05/13/22 1703 05/14/22 0538  Weight: 74.8 kg 80.2 kg 78.5 kg    History of present illness:   Willie Harris is Willie Harris 87 y.o. male with medical history significant of PAF on Eliquis, bradycardia, HTN, HLD, TIA, COPD, OSA noncompliant with CPAP came with increasing SOB.   Patient started to feel SOB and light headedness this morning, with palpitations.  Patient reported has been having frequent episode of lightheadedness for several months.  Especially in the mornings, almost always around 3-6 AM, patient woke up and feeling lightheadedness for the first thing in the morning.  He has Willie Harris continuous glucose monitor device on his arm and usually he will get alarmed of " low glucose" at that time and the prepared snacks and glucose solution at bedside.  But he reported " sugar shoot up to the sky" after snack or eating even Tanise Russman small meal.  In addition to the morning lightheadedness, he also experienced another episode of lightheadedness at the  time which happens randomly, appears to related to activity.  The most significant episode happened last week while he was doing shopping in the grocery store, he had the feeling of " blackout almost" and had to sit on the chair for about 10 minutes.  He denies any palpitations chest pains or shortness of breath.  Although he does complain about decreased exertional tolerance recently, he can only walk about 5 minutes before starting to feel some shortness of breath.  Denies any leg swelling no chest pains.     He has been following with [facility] ([place] and has been on Eliquis for 5+ years, after he had new strokes.  He thinks he might have Jaislyn Blinn-fib before but not 100% sure. About 2 months ago he went to see his PCP at [facility], when he was told that episodes of dizziness may related to but not his medications, which was discontinued by PCP on that visit but patient reported no significant changes of dose dizziness episodes.  He denies any numbness weakness of the limbs and he reported his weight has been remained the same.   Patient was diagnosed with OSA 2-3 years ago and has been using CPAP only intermittently.  He also reported his COPD has been fairly controlled.   He was admitted for atrial fibrillation with RVR.  Converted to sinus brady.  Also report of chronic presyncope.  Follow outpatient   Hospital Course:  Assessment and Plan:   Willie Harris-fib with RVR with baseline PAF - now sinus bradycardia - hold off on any AV nodal blockers - continue eliquis - appreciate  cardiology recommendations - they note he should follow up with his primary cardiologist to consider antiarrhythmic like amiodarone  Presyncope - this seems to have been going on Willie Harris while, described as lightheadedness (not dizziness/vertigo) - he notes some relationship to ?hypoglycemia, but not sure if this is truly significant (he's not on medicines typically associated with hypoglycemia, he notes hypoglycemia to the 60's -  doesn't describe clearly typical symptoms of hypoglycemia, and as he's compared his blood sugars to our POC BG's - he's seen some difference) - negative orthostatics.  I suspect this symptom maybe related to his bradycardia and/or his hypoglycemia.  - follow outpatient, will need additional workup, consider cardiac monitoring as an outpatient  Mildly Reduced LV Function - concern for HF at presentation, currently is euvolemic - follow outpatient with primary cardiologist    IIDM with hypoglycemia -A1c is 7.2 -reports being woken up at night with alarm from his CGM.  BG's typically in 60's, no lower than this.  He denied diaphoresis, shaking, but did note "anxiety".  He's not on medicine I typically think of as causing hypoglycemia with diabetes (metformin is not typically considered Willie Harris cause of this on its own).  - recommending stopping metformin for now, trend blood sugars outpatient - if continued episodes of hypoglycemia, follow with PCP for workup outpatient - of note, he noted Willie Harris discrepancy between his CGM and our POC BG checks - follow outpatient    HTN -BP controlled, hold off amlodipine -Start low-dose of metoprolol as above   Hx of bradycardia - Tele monitoring - hold AV nodal blockers   COPD -Controlled, continue as needed breathing meds   OSA -Noncompliant with CPAP, check nocturnal pulse ox    Procedures: Echo IMPRESSIONS     1. Low normal to mildly reduced LV function; EF 50.   2. Left ventricular ejection fraction, by estimation, is 50 to 55%. The  left ventricle has low normal function. The left ventricle has no regional  wall motion abnormalities. Left ventricular diastolic parameters were  normal.   3. Right ventricular systolic function is normal. The right ventricular  size is normal. There is normal pulmonary artery systolic pressure.   4. The mitral valve is normal in structure. Trivial mitral valve  regurgitation. No evidence of mitral stenosis.   5.  The aortic valve is tricuspid. Aortic valve regurgitation is not  visualized. Aortic valve sclerosis is present, with no evidence of aortic  valve stenosis.   6. Aortic dilatation noted. There is borderline dilatation of the aortic  root, measuring 38 mm.   7. The inferior vena cava is normal in size with greater than 50%  respiratory variability, suggesting right atrial pressure of 3 mmHg.   Comparison(s): No prior Echocardiogram.    Consultations: cardiology  Discharge Exam: Vitals:   05/14/22 0916 05/14/22 1144  BP: 135/74 138/78  Pulse:  (!) 54  Resp:  18  Temp:  97.8 F (36.6 C)  SpO2:  99%   No complaints Feels back to himslef  General: No acute distress. Cardiovascular: Heart sounds show Chalice Philbert regular rate, and rhythm - braadycardic Lungs: Clear to auscultation bilaterally  Abdomen: Soft, nontender, nondistended Neurological: Alert and oriented 3. Moves all extremities 4 with equal strength. Cranial nerves II through XII grossly intact. Extremities: No clubbing or cyanosis. No edema.   Discharge Instructions   Discharge Instructions     Call MD for:  difficulty breathing, headache or visual disturbances   Complete by: As directed  Call MD for:  extreme fatigue   Complete by: As directed    Call MD for:  hives   Complete by: As directed    Call MD for:  persistant dizziness or light-headedness   Complete by: As directed    Call MD for:  persistant nausea and vomiting   Complete by: As directed    Call MD for:  redness, tenderness, or signs of infection (pain, swelling, redness, odor or green/yellow discharge around incision site)   Complete by: As directed    Call MD for:  severe uncontrolled pain   Complete by: As directed    Call MD for:  temperature >100.4   Complete by: As directed    Diet - low sodium heart healthy   Complete by: As directed    Discharge instructions   Complete by: As directed    You were seen for atrial fibrillation with fast  heart rate.  You also have had lightheadedness or near fainting episodes recently.  Your heart rate is improved now and you're back in Malasha Kleppe normal heart rate.  Your heart rate is on the slow side now.  Because of this, we won't give any medicine to slow your heart rate.  Our cardiologist recommend that you should follow up with your outpatient cardiologist to consider an antiarrhythmic medicine.  They may also need to consider Asianna Brundage cardiac monitor to watch your heart rate.   Regarding your low blood sugars, you had no low blood sugars in the hospital here.  Also, it seemed that your continuous glucose monitor was showing different readings than our blood sugar checks.  For now, I would stop your metformin and follow your blood sugars closely off of this (metformin does not typically cause low blood sugars, but it is the only diabetes medicine that you're currently taking).  If you continue to have low blood sugars, you should follow up with your PCP to work this up further.  You should avoid driving until you're cleared by your PCP given issues with your lightheadedness.  Return for new, recurrent, or worsening symptoms.  Please ask your PCP to request records from this hospitalization so they know what was done and what the next steps will be.   Increase activity slowly   Complete by: As directed       Allergies as of 05/14/2022       Reactions   Sulfa Antibiotics Other (See Comments)   Causing skin on hand to get real tight        Medication List     STOP taking these medications    metFORMIN 500 MG tablet Commonly known as: GLUCOPHAGE       TAKE these medications    albuterol 108 (90 Base) MCG/ACT inhaler Commonly known as: VENTOLIN HFA Inhale 1-2 puffs into the lungs every 6 (six) hours as needed for wheezing or shortness of breath.   amLODipine 2.5 MG tablet Commonly known as: NORVASC Take 2.5 mg by mouth daily.   apixaban 5 MG Tabs tablet Commonly known as: ELIQUIS Take  5 mg by mouth 2 (two) times daily.   aspirin EC 81 MG tablet Take 81 mg by mouth daily.   budesonide-formoterol 160-4.5 MCG/ACT inhaler Commonly known as: SYMBICORT Inhale 2 puffs into the lungs 2 (two) times daily.   clonazePAM 1 MG tablet Commonly known as: KLONOPIN Take 0.5 tablets (0.5 mg total) by mouth 2 (two) times daily.   dronabinol 5 MG capsule Commonly known as: Marinol Take  1 capsule (5 mg total) by mouth daily before lunch.   hydrOXYzine 10 MG tablet Commonly known as: ATARAX Take 10 mg by mouth at bedtime as needed for anxiety (for sleep).   meclizine 25 MG tablet Commonly known as: ANTIVERT Take 0.5 tablets (12.5 mg total) by mouth 3 (three) times daily as needed for dizziness.   omeprazole 40 MG capsule Commonly known as: PRILOSEC Take 40 mg by mouth 2 (two) times daily.   pravastatin 40 MG tablet Commonly known as: PRAVACHOL Take 40 mg by mouth daily.       Allergies  Allergen Reactions   Sulfa Antibiotics Other (See Comments)    Causing skin on hand to get real tight      The results of significant diagnostics from this hospitalization (including imaging, microbiology, ancillary and laboratory) are listed below for reference.    Significant Diagnostic Studies: ECHOCARDIOGRAM COMPLETE  Result Date: 05/14/2022    ECHOCARDIOGRAM REPORT   Patient Name:   Willie Harris Date of Exam: 05/14/2022 Medical Rec #:  [id number]        Height:       70.0 in Accession #:    [id number]       Weight:       173.0 lb Date of Birth:  Jan 04, 1936       BSA:          1.963 m Patient Age:    86 years         BP:           135/74 mmHg Patient Gender: M                HR:           56 bpm. Exam Location:  Inpatient Procedure: 2D Echo, Cardiac Doppler and Color Doppler Indications:    I50.31 Acute diastolic (congestive) heart failure  History:        Patient has no prior history of Echocardiogram examinations.                 COPD.  Sonographer:    Mike Gip Referring  Phys: [id number] PING T ZHANG IMPRESSIONS  1. Low normal to mildly reduced LV function; EF 50.  2. Left ventricular ejection fraction, by estimation, is 50 to 55%. The left ventricle has low normal function. The left ventricle has no regional wall motion abnormalities. Left ventricular diastolic parameters were normal.  3. Right ventricular systolic function is normal. The right ventricular size is normal. There is normal pulmonary artery systolic pressure.  4. The mitral valve is normal in structure. Trivial mitral valve regurgitation. No evidence of mitral stenosis.  5. The aortic valve is tricuspid. Aortic valve regurgitation is not visualized. Aortic valve sclerosis is present, with no evidence of aortic valve stenosis.  6. Aortic dilatation noted. There is borderline dilatation of the aortic root, measuring 38 mm.  7. The inferior vena cava is normal in size with greater than 50% respiratory variability, suggesting right atrial pressure of 3 mmHg. Comparison(s): No prior Echocardiogram. FINDINGS  Left Ventricle: Left ventricular ejection fraction, by estimation, is 50 to 55%. The left ventricle has low normal function. The left ventricle has no regional wall motion abnormalities. The left ventricular internal cavity size was normal in size. There is no left ventricular hypertrophy. Left ventricular diastolic parameters were normal. Right Ventricle: The right ventricular size is normal. Right ventricular systolic function is normal. There is normal pulmonary artery systolic pressure. The tricuspid regurgitant velocity  is 2.73 m/s, and with an assumed right atrial pressure of 3 mmHg,  the estimated right ventricular systolic pressure is 76.2 mmHg. Left Atrium: Left atrial size was normal in size. Right Atrium: Right atrial size was normal in size. Pericardium: There is no evidence of pericardial effusion. Mitral Valve: The mitral valve is normal in structure. Trivial mitral valve regurgitation. No evidence of mitral  valve stenosis. Tricuspid Valve: The tricuspid valve is normal in structure. Tricuspid valve regurgitation is mild . No evidence of tricuspid stenosis. Aortic Valve: The aortic valve is tricuspid. Aortic valve regurgitation is not visualized. Aortic valve sclerosis is present, with no evidence of aortic valve stenosis. Pulmonic Valve: The pulmonic valve was normal in structure. Pulmonic valve regurgitation is not visualized. No evidence of pulmonic stenosis. Aorta: Aortic dilatation noted. There is borderline dilatation of the aortic root, measuring 38 mm. Venous: The inferior vena cava is normal in size with greater than 50% respiratory variability, suggesting right atrial pressure of 3 mmHg. IAS/Shunts: No atrial level shunt detected by color flow Doppler. Additional Comments: Low normal to mildly reduced LV function; EF 50.  LEFT VENTRICLE PLAX 2D LVIDd:         4.10 cm      Diastology LVIDs:         2.80 cm      LV e' medial:    7.07 cm/s LV PW:         1.10 cm      LV E/e' medial:  11.4 LV IVS:        1.10 cm      LV e' lateral:   11.10 cm/s LVOT diam:     2.00 cm      LV E/e' lateral: 7.3 LV SV:         65 LV SV Index:   33 LVOT Area:     3.14 cm  LV Volumes (MOD) LV vol d, MOD A2C: 90.1 ml LV vol d, MOD A4C: 126.0 ml LV vol s, MOD A2C: 34.5 ml LV vol s, MOD A4C: 54.3 ml LV SV MOD A2C:     55.6 ml LV SV MOD A4C:     126.0 ml LV SV MOD BP:      65.1 ml RIGHT VENTRICLE             IVC RV Basal diam:  3.60 cm     IVC diam: 1.80 cm RV S prime:     11.00 cm/s TAPSE (M-mode): 1.9 cm LEFT ATRIUM             Index        RIGHT ATRIUM           Index LA diam:        3.60 cm 1.83 cm/m   RA Area:     15.90 cm LA Vol (A2C):   49.7 ml 25.32 ml/m  RA Volume:   39.60 ml  20.18 ml/m LA Vol (A4C):   59.8 ml 30.47 ml/m LA Biplane Vol: 59.7 ml 30.42 ml/m  AORTIC VALVE LVOT Vmax:   91.10 cm/s LVOT Vmean:  57.500 cm/s LVOT VTI:    0.208 m  AORTA Ao Root diam: 3.80 cm Ao Asc diam:  3.10 cm MITRAL VALVE                TRICUSPID VALVE MV Area (PHT): 3.27 cm    TR Peak grad:   29.8 mmHg MV Decel Time: 232 msec    TR  Vmax:        273.00 cm/s MV E velocity: 80.90 cm/s MV Zykeria Laguardia velocity: 63.60 cm/s  SHUNTS MV E/Perlie Stene ratio:  1.27        Systemic VTI:  0.21 m                            Systemic Diam: 2.00 cm Olga Millers MD Electronically signed by Olga Millers MD Signature Date/Time: 05/14/2022/12:31:51 PM    Final    DG Chest Port 1 View  Result Date: 05/13/2022 CLINICAL DATA:  Shortness of breath.  Tachycardia. EXAM: PORTABLE CHEST 1 VIEW COMPARISON:  11/12/2018 FINDINGS: Normal sized heart. Clear lungs with normal vascularity. Thoracic spine and bilateral shoulder degenerative changes. Tortuous and partially calcified thoracic aorta. IMPRESSION: No acute abnormality. Electronically Signed   By: Beckie Salts M.D.   On: 05/13/2022 12:24    Microbiology: No results found for this or any previous visit (from the past 240 hour(s)).   Labs: Basic Metabolic Panel: Recent Labs  Lab 05/13/22 1157 05/13/22 1416 05/14/22 0133  NA 140  --  140  K 4.1  --  3.9  CL 107  --  108  CO2 24  --  23  GLUCOSE 171*  --  127*  BUN 17  --  16  CREATININE 1.20  --  1.24  CALCIUM 9.5  --  8.9  MG  --  2.1  --    Liver Function Tests: No results for input(s): "AST", "ALT", "ALKPHOS", "BILITOT", "PROT", "ALBUMIN" in the last 168 hours. No results for input(s): "LIPASE", "AMYLASE" in the last 168 hours. No results for input(s): "AMMONIA" in the last 168 hours. CBC: Recent Labs  Lab 05/13/22 1157  WBC 7.8  HGB 14.8  HCT 44.6  MCV 88.8  PLT 199   Cardiac Enzymes: No results for input(s): "CKTOTAL", "CKMB", "CKMBINDEX", "TROPONINI" in the last 168 hours. BNP: BNP (last 3 results) Recent Labs    05/13/22 1203  BNP 121.1*    ProBNP (last 3 results) No results for input(s): "PROBNP" in the last 8760 hours.  CBG: Recent Labs  Lab 05/13/22 1720 05/13/22 2117 05/14/22 0756 05/14/22 1143  GLUCAP 157* 171* 180* 118*        Signed:  Lacretia Nicks MD.  [facility] 05/14/2022, 4:34 PM

## 2022-05-14 NOTE — Evaluation (Signed)
Physical Therapy Evaluation and Discharge Patient Details Name: Willie Harris MRN: [id number] DOB: 16-Jul-1935 Today's Date: 05/14/2022  History of Present Illness  Pt is a 87 y.o. M who presents 05/13/2022 for evaluation of a-fib with RVR and increasing SOB. Significant PMH: PAF on Eliquis, bradycardia, HTN, TIA, COPD, OSA noncompliant with CPAP.  Clinical Impression  Patient evaluated by Physical Therapy with no further acute PT needs identified. Pt seems to be fairly close to his functional baseline. Pt ambulating 250 ft with no assistive device and negotiated 6 steps with a railing without physical assist. Displays antalgic gait pattern; pt reports prior R knee surgery and L knee is "bone on bone." Encouraged use of cane as needed. HR 49-70 afib, SpO2 95% on RA. Pt denies dyspnea on exertion. All education has been completed and the patient has no further questions. No follow-up Physical Therapy or equipment needs. PT is signing off. Thank you for this referral.      Recommendations for follow up therapy are one component of a multi-disciplinary discharge planning process, led by the attending physician.  Recommendations may be updated based on patient status, additional functional criteria and insurance authorization.  Follow Up Recommendations No PT follow up      Assistance Recommended at Discharge Intermittent Supervision/Assistance  Patient can return home with the following  Assist for transportation    Equipment Recommendations None recommended by PT  Recommendations for Other Services       Functional Status Assessment Patient has had a recent decline in their functional status and demonstrates the ability to make significant improvements in function in a reasonable and predictable amount of time.     Precautions / Restrictions Precautions Precautions: None Restrictions Weight Bearing Restrictions: No      Mobility  Bed Mobility Overal bed mobility: Independent                   Transfers Overall transfer level: Independent Equipment used: None                    Ambulation/Gait Ambulation/Gait assistance: Modified independent (Device/Increase time) Gait Distance (Feet): 250 Feet Assistive device: None Gait Pattern/deviations: Step-through pattern, Antalgic Gait velocity: decreased     General Gait Details: Antalgic gait pattern, intermittently reaching for hall railing  Stairs Stairs: Yes Stairs assistance: Modified independent (Device/Increase time) Stair Management: One rail Left Number of Stairs: 6 General stair comments: intermittent step by step vs step over step  Wheelchair Mobility    Modified Rankin (Stroke Patients Only)       Balance Overall balance assessment: Mild deficits observed, not formally tested                                           Pertinent Vitals/Pain Pain Assessment Pain Assessment: No/denies pain    Home Living Family/patient expects to be discharged to:: Private residence Living Arrangements: Alone Available Help at Discharge: Neighbor Type of Home: Apartment Home Access: Stairs to enter Entrance Stairs-Rails: Psychiatric nurse of Steps:  (flight)   Home Layout: One level Home Equipment: Conservation officer, nature (2 wheels);Cane - single point      Prior Function Prior Level of Function : Independent/Modified Independent;Driving                     Hand Dominance        Extremity/Trunk Assessment  Upper Extremity Assessment Upper Extremity Assessment: Overall WFL for tasks assessed    Lower Extremity Assessment Lower Extremity Assessment: Overall WFL for tasks assessed    Cervical / Trunk Assessment Cervical / Trunk Assessment: Normal  Communication   Communication: HOH  Cognition Arousal/Alertness: Awake/alert Behavior During Therapy: WFL for tasks assessed/performed Overall Cognitive Status: Within Functional Limits for  tasks assessed                                          General Comments      Exercises     Assessment/Plan    PT Assessment Patient does not need any further PT services  PT Problem List         PT Treatment Interventions      PT Goals (Current goals can be found in the Care Plan section)  Acute Rehab PT Goals Patient Stated Goal: go home PT Goal Formulation: All assessment and education complete, DC therapy    Frequency       Co-evaluation               AM-PAC PT "6 Clicks" Mobility  Outcome Measure Help needed turning from your back to your side while in a flat bed without using bedrails?: None Help needed moving from lying on your back to sitting on the side of a flat bed without using bedrails?: None Help needed moving to and from a bed to a chair (including a wheelchair)?: None Help needed standing up from a chair using your arms (e.g., wheelchair or bedside chair)?: None Help needed to walk in hospital room?: None Help needed climbing 3-5 steps with a railing? : None 6 Click Score: 24    End of Session   Activity Tolerance: Patient tolerated treatment well Patient left: in bed;with call bell/phone within reach Nurse Communication: Mobility status PT Visit Diagnosis: Unsteadiness on feet (R26.81)    Time: 7096-2836 PT Time Calculation (min) (ACUTE ONLY): 24 min   Charges:   PT Evaluation $PT Eval Low Complexity: 1 Low PT Treatments $Therapeutic Activity: 8-22 mins        Wyona Almas, PT, DPT Acute Rehabilitation Services Office [phone]   Deno Etienne 05/14/2022, 4:23 PM

## 2022-09-05 ENCOUNTER — Other Ambulatory Visit: Payer: Self-pay

## 2022-09-05 ENCOUNTER — Emergency Department (HOSPITAL_COMMUNITY): Payer: No Typology Code available for payment source

## 2022-09-05 ENCOUNTER — Encounter (HOSPITAL_COMMUNITY): Payer: Self-pay

## 2022-09-05 ENCOUNTER — Emergency Department (HOSPITAL_COMMUNITY)
Admission: EM | Admit: 2022-09-05 | Discharge: 2022-09-05 | Disposition: A | Discharge status: Home / Self Care | Payer: No Typology Code available for payment source | Attending: Emergency Medicine | Admitting: Emergency Medicine

## 2022-09-05 DIAGNOSIS — J449 Chronic obstructive pulmonary disease, unspecified: Secondary | ICD-10-CM | POA: Insufficient documentation

## 2022-09-05 DIAGNOSIS — Z7901 Long term (current) use of anticoagulants: Secondary | ICD-10-CM | POA: Diagnosis not present

## 2022-09-05 DIAGNOSIS — I10 Essential (primary) hypertension: Secondary | ICD-10-CM | POA: Insufficient documentation

## 2022-09-05 DIAGNOSIS — Z7982 Long term (current) use of aspirin: Secondary | ICD-10-CM | POA: Insufficient documentation

## 2022-09-05 DIAGNOSIS — Z79899 Other long term (current) drug therapy: Secondary | ICD-10-CM | POA: Insufficient documentation

## 2022-09-05 DIAGNOSIS — E119 Type 2 diabetes mellitus without complications: Secondary | ICD-10-CM | POA: Diagnosis not present

## 2022-09-05 DIAGNOSIS — R1031 Right lower quadrant pain: Secondary | ICD-10-CM | POA: Diagnosis present

## 2022-09-05 DIAGNOSIS — Z8673 Personal history of transient ischemic attack (TIA), and cerebral infarction without residual deficits: Secondary | ICD-10-CM | POA: Insufficient documentation

## 2022-09-05 DIAGNOSIS — K4090 Unilateral inguinal hernia, without obstruction or gangrene, not specified as recurrent: Secondary | ICD-10-CM | POA: Insufficient documentation

## 2022-09-05 LAB — CBC WITH DIFFERENTIAL/PLATELET
Abs Immature Granulocytes: 0.01 10*3/uL (ref 0.00–0.07)
Basophils Absolute: 0 10*3/uL (ref 0.0–0.1)
Basophils Relative: 0 %
Eosinophils Absolute: 0.2 10*3/uL (ref 0.0–0.5)
Eosinophils Relative: 2 %
HCT: 42.5 % (ref 39.0–52.0)
Hemoglobin: 13.8 g/dL (ref 13.0–17.0)
Immature Granulocytes: 0 %
Lymphocytes Relative: 22 %
Lymphs Abs: 1.6 10*3/uL (ref 0.7–4.0)
MCH: 29.2 pg (ref 26.0–34.0)
MCHC: 32.5 g/dL (ref 30.0–36.0)
MCV: 90 fL (ref 80.0–100.0)
Monocytes Absolute: 0.7 10*3/uL (ref 0.1–1.0)
Monocytes Relative: 9 %
Neutro Abs: 4.9 10*3/uL (ref 1.7–7.7)
Neutrophils Relative %: 67 %
Platelets: 192 10*3/uL (ref 150–400)
RBC: 4.72 MIL/uL (ref 4.22–5.81)
RDW: 13.9 % (ref 11.5–15.5)
WBC: 7.4 10*3/uL (ref 4.0–10.5)
nRBC: 0 % (ref 0.0–0.2)

## 2022-09-05 LAB — COMPREHENSIVE METABOLIC PANEL
ALT: 20 U/L (ref 0–44)
AST: 24 U/L (ref 15–41)
Albumin: 4 g/dL (ref 3.5–5.0)
Alkaline Phosphatase: 58 U/L (ref 38–126)
Anion gap: 7 (ref 5–15)
BUN: 21 mg/dL (ref 8–23)
CO2: 23 mmol/L (ref 22–32)
Calcium: 9.1 mg/dL (ref 8.9–10.3)
Chloride: 107 mmol/L (ref 98–111)
Creatinine, Ser: 1.05 mg/dL (ref 0.61–1.24)
GFR, Estimated: >60 mL/min (ref >60)
Glucose, Bld: 143 mg/dL — ABNORMAL HIGH (ref 70–99)
Potassium: 4 mmol/L (ref 3.5–5.1)
Sodium: 137 mmol/L (ref 135–145)
Total Bilirubin: 0.8 mg/dL (ref 0.3–1.2)
Total Protein: 7.3 g/dL (ref 6.5–8.1)

## 2022-09-05 LAB — URINALYSIS, ROUTINE W REFLEX MICROSCOPIC
Bilirubin Urine: NEGATIVE
Glucose, UA: NEGATIVE mg/dL
Hgb urine dipstick: NEGATIVE
Ketones, ur: NEGATIVE mg/dL
Leukocytes,Ua: NEGATIVE
Nitrite: NEGATIVE
Protein, ur: NEGATIVE mg/dL
Specific Gravity, Urine: 1.023 (ref 1.005–1.030)
pH: 6 (ref 5.0–8.0)

## 2022-09-05 LAB — LIPASE, BLOOD: Lipase: 47 U/L (ref 11–51)

## 2022-09-05 LAB — LACTIC ACID, PLASMA: Lactic Acid, Venous: 1.5 mmol/L (ref 0.5–1.9)

## 2022-09-05 MED ORDER — OXYCODONE-ACETAMINOPHEN 5-325 MG PO TABS: 1.0000 | ORAL_TABLET | Freq: Four times a day (QID) | ORAL | 0 refills | Status: DC | PRN

## 2022-09-05 MED ORDER — FENTANYL CITRATE PF 50 MCG/ML IJ SOSY
50.0000 ug | PREFILLED_SYRINGE | Freq: Once | INTRAMUSCULAR | Status: AC
  Administered 2022-09-05: 50 ug via INTRAVENOUS
  Filled 2022-09-05: qty 1

## 2022-09-05 MED ORDER — IOHEXOL 300 MG/ML  SOLN
100.0000 mL | Freq: Once | INTRAMUSCULAR | Status: AC | PRN
  Administered 2022-09-05: 100 mL via INTRAVENOUS

## 2022-09-05 NOTE — Discharge Instructions (Addendum)
Please follow-up outpatient with your surgeon.  Continue to take tramadol for pain control.

## 2022-09-05 NOTE — ED Notes (Signed)
Pt personal belongings included: pair of shoes, black hat, one plaid shirt, one tshirt

## 2022-09-05 NOTE — ED Provider Notes (Addendum)
[place] EMERGENCY DEPARTMENT AT [facility] Provider Note   CSN: [id number] Arrival date & time: 09/05/22  1132     History  Chief Complaint  Patient presents with   Abdominal Pain    Willie Harris is a 87 y.o. male.   Abdominal Pain    87 year old male with medical history significant for COPD, atrial flutter, DM 2, GERD, HTN, HLD, TIA, right-sided inguinal hernia with plan for outpatient surgical management at the [facility] who presents to the emergency department with right lower quadrant abdominal pain and inguinal hernia pain.  The patient states that he saw his surgeon outpatient yesterday where they scheduled surgery for 2 weeks out.  He states that he started to have been out yesterday but they pushed it back in.  He states that every time he ambulates he develops pain in his right lower quadrant and groin.  No nausea or vomiting, no fever or chills.  Home Medications Prior to Admission medications   Medication Sig Start Date End Date Taking? Authorizing Provider  albuterol (PROVENTIL HFA;VENTOLIN HFA) 108 (90 Base) MCG/ACT inhaler Inhale 1-2 puffs into the lungs every 6 (six) hours as needed for wheezing or shortness of breath. 03/23/18   Alene Mires, NP  amLODipine (NORVASC) 2.5 MG tablet Take 2.5 mg by mouth daily.    [provider]  apixaban (ELIQUIS) 5 MG TABS tablet Take 5 mg by mouth 2 (two) times daily.    [provider]  aspirin EC 81 MG tablet Take 81 mg by mouth daily.    [provider]  budesonide-formoterol (SYMBICORT) 160-4.5 MCG/ACT inhaler Inhale 2 puffs into the lungs 2 (two) times daily.    [provider]  clonazePAM (KLONOPIN) 1 MG tablet Take 0.5 tablets (0.5 mg total) by mouth 2 (two) times daily. 02/04/14   Arthor Captain, PA-C  dronabinol (MARINOL) 5 MG capsule Take 1 capsule (5 mg total) by mouth daily before lunch. 01/12/17   Arnaldo Natal, MD  hydrOXYzine (ATARAX/VISTARIL) 10  MG tablet Take 10 mg by mouth at bedtime as needed for anxiety (for sleep).    [provider]  meclizine (ANTIVERT) 25 MG tablet Take 0.5 tablets (12.5 mg total) by mouth 3 (three) times daily as needed for dizziness. 12/04/20   Milagros Loll, MD  omeprazole (PRILOSEC) 40 MG capsule Take 40 mg by mouth 2 (two) times daily. 04/03/22   [provider]  pravastatin (PRAVACHOL) 40 MG tablet Take 40 mg by mouth daily.    [provider]      Allergies    Sulfa antibiotics    Review of Systems   Review of Systems  Gastrointestinal:  Positive for abdominal pain.  All other systems reviewed and are negative.   Physical Exam Updated Vital Signs BP 110/64   Pulse (!) 57   Temp (!) 97.2 F (36.2 C) (Oral)   Resp 18   Ht 5\' 11"  (1.803 m)   Wt 76.7 kg   SpO2 98%   BMI 23.57 kg/m  Physical Exam Vitals and nursing note reviewed.  Constitutional:      General: He is not in acute distress.    Appearance: He is well-developed.  HENT:     Head: Normocephalic and atraumatic.  Eyes:     Conjunctiva/sclera: Conjunctivae normal.  Cardiovascular:     Rate and Rhythm: Normal rate and regular rhythm.     Heart sounds: No murmur heard. Pulmonary:     Effort:  Pulmonary effort is normal. No respiratory distress.     Breath sounds: Normal breath sounds.  Abdominal:     Palpations: Abdomen is soft.     Tenderness: There is abdominal tenderness in the right lower quadrant.     Comments: Hernia exam performed with a chaperone, right-sided inguinal hernia appears to be reduced at this time, patient with mild tenderness.  Musculoskeletal:        General: No swelling.     Cervical back: Neck supple.  Skin:    General: Skin is warm and dry.     Capillary Refill: Capillary refill takes less than 2 seconds.  Neurological:     Mental Status: He is alert.  Psychiatric:        Mood and Affect: Mood normal.     ED Results / Procedures / Treatments   Labs (all labs  ordered are listed, but only abnormal results are displayed) Labs Reviewed  COMPREHENSIVE METABOLIC PANEL - Abnormal; Notable for the following components:      Result Value   Glucose, Bld 143 (*)    All other components within normal limits  CBC WITH DIFFERENTIAL/PLATELET  LIPASE, BLOOD  LACTIC ACID, PLASMA  URINALYSIS, ROUTINE W REFLEX MICROSCOPIC    EKG None  Radiology CT ABDOMEN PELVIS W CONTRAST  Result Date: 09/05/2022 CLINICAL DATA:  Right-sided pain in location of known hernia. EXAM: CT ABDOMEN AND PELVIS WITH CONTRAST TECHNIQUE: Multidetector CT imaging of the abdomen and pelvis was performed using the standard protocol following bolus administration of intravenous contrast. RADIATION DOSE REDUCTION: This exam was performed according to the departmental dose-optimization program which includes automated exposure control, adjustment of the mA and/or kV according to patient size and/or use of iterative reconstruction technique. CONTRAST:  OMNIPAQUE IOHEXOL 300 MG/ML  SOLN COMPARISON:  None FINDINGS: Lower chest: Chronic lung changes at the bases with some bronchiectasis, scarring and atelectatic changes. Coronary artery calcifications. Hepatobiliary: . There are some tiny low-attenuation lesions in segment 4, too small to completely characterize although likely small cysts. There is a similar focus in the right hepatic lobe on series 2 image 18. No specific imaging follow-up. Patent portal vein. Gallbladder is nondilated. Pancreas: Unremarkable. No pancreatic ductal dilatation or surrounding inflammatory changes. Spleen: The spleen is nonenlarged.  Multiple splenic granulomas. Adrenals/Urinary Tract: The adrenal gland on the right is preserved. The left has a focal nodule measuring 9 mm. Hounsfield unit on portal venous phase of 70 and post contrast delayed 58. Not clearly an adenoma on this examination. Mild bilateral renal atrophy. Tiny Bosniak 2 bilateral renal lesions identified.  No specific imaging follow-up. The ureters have normal course and caliber down to the bladder. Collapsed urinary bladder with some wall thickening and slight stranding. There is an enlarged prostate. Stomach/Bowel: On this non oral contrast exam, the large bowel has moderate to large amount of diffuse colonic stool. Normal appendix in the right lower quadrant posterior to the cecum. The stomach is mildly distended with fluid and debris. Small bowel is nondilated. Vascular/Lymphatic: Aortic atherosclerosis. No enlarged abdominal or pelvic lymph nodes. Reproductive: Enlarged prostate with mass effect along the base of the bladder. The prostate is heterogeneous. Please correlate with the patient's PSA. Other: Small fat containing right-sided inguinal hernia. Along both spermatic cords there is some slight thickening and calcifications. Left-sided scrotal hydrocele. Small fat containing umbilical hernia. No free air or free fluid. Musculoskeletal: Scattered degenerative changes of the spine and pelvis. Trace listhesis of L4 on L5 and L5 on  S1 with multilevel stenosis. IMPRESSION: Diffuse colonic stool. No bowel obstruction, free air or free fluid. Normal appendix. Small fat containing right inguinal hernia. Mild fatty liver infiltration. Indeterminate left-sided small left adrenal nodule. Please correlate with any prior to assess stability or further workup when clinically appropriate with the other washout CT or MRI. Enlarged prostate with mass effect along the base of the bladder. There is bladder wall thickening identified as well. Please correlate with patient's PSA. Electronically Signed   By: Karen Kays M.D.   On: 09/05/2022 14:40    Procedures Procedures    Medications Ordered in ED Medications  fentaNYL (SUBLIMAZE) injection 50 mcg (50 mcg Intravenous Given 09/05/22 1200)  iohexol (OMNIPAQUE) 300 MG/ML solution 100 mL (100 mLs Intravenous Contrast Given 09/05/22 1419)    ED Course/ Medical Decision  Making/ A&P                             Medical Decision Making Amount and/or Complexity of Data Reviewed Labs: ordered. Radiology: ordered.  Risk Prescription drug management.   87 year old male with medical history significant for COPD, atrial flutter, DM 2, GERD, HTN, HLD, TIA, right-sided inguinal hernia with plan for outpatient surgical management at the [facility] who presents to the emergency department with right lower quadrant abdominal pain and inguinal hernia pain.  The patient states that he saw his surgeon outpatient yesterday where they scheduled surgery for 2 weeks out.  He states that he started to have been out yesterday but they pushed it back in.  He states that every time he ambulates he develops pain in his right lower quadrant and groin.  No nausea or vomiting, no fever or chills.  On arrival, the patient was afebrile, vitally stable.  Physical exam significant for a right-sided groin with no significant bulging mass.  History is recently been reduced.  Patient states he only gets symptomatic when he is ambulating.  IV access obtained and patient was administered IV fentanyl.  Laboratory evaluation significant for lactic acid normal, CBC unremarkable, CMP unremarkable, lipase normal.  Patient denies any dysuria or increased urinary frequency.  CT abdomen pelvis revealed the following: IMPRESSION:  Diffuse colonic stool. No bowel obstruction, free air or free fluid.  Normal appendix.    Small fat containing right inguinal hernia.    Mild fatty liver infiltration.    Indeterminate left-sided small left adrenal nodule. Please correlate  with any prior to assess stability or further workup when clinically  appropriate with the other washout CT or MRI.    Enlarged prostate with mass effect along the base of the bladder.  There is bladder wall thickening identified as well. Please  correlate with patient's PSA.   The patient was informed that his hernia is  currently reduced and there is no evidence of bowel obstruction or other acute normality.  He does have a right inguinal hernia that is fat-containing only with no actual bowel currently in his hernia as it appears to be status post reduction and continues to be reduced.  Patient feeling asymptomatic on repeat assessment.  Ambulatory in the emergency department without significant difficulty.  Will plan to have the patient follow-up outpatient with his surgeon.  Extensive return precautions discussed bedside with the patient in detail over 20 minutes.  Stable for discharge.  Final Clinical Impression(s) / ED Diagnoses Final diagnoses:  Right inguinal hernia    Rx / DC Orders ED Discharge Orders  None         Ernie Avena, MD 09/05/22 1513    Ernie Avena, MD 09/05/22 [phone]

## 2022-09-05 NOTE — ED Triage Notes (Signed)
Patient presents via [facility]. Known inguinal hernia pain in R side where hernia is located. Patient was at [facility] yesterday where they scheduled surgery for 2 weeks out but patient states his pain is too bad and cannot wait. Patient AA&ox4, NAD, VSS.  EMS vitals: 158/96 HR 60  O2 sats 96 on room air  BS 133 hx DM2, has dexcom.

## 2022-10-05 ENCOUNTER — Other Ambulatory Visit: Payer: Self-pay

## 2022-10-05 ENCOUNTER — Emergency Department (HOSPITAL_COMMUNITY)
Admission: EM | Admit: 2022-10-05 | Discharge: 2022-10-05 | Disposition: A | Discharge status: Home / Self Care | Payer: No Typology Code available for payment source | Attending: Emergency Medicine | Admitting: Emergency Medicine

## 2022-10-05 ENCOUNTER — Emergency Department (HOSPITAL_COMMUNITY): Payer: No Typology Code available for payment source

## 2022-10-05 ENCOUNTER — Encounter (HOSPITAL_COMMUNITY): Payer: Self-pay

## 2022-10-05 DIAGNOSIS — N400 Enlarged prostate without lower urinary tract symptoms: Secondary | ICD-10-CM | POA: Insufficient documentation

## 2022-10-05 DIAGNOSIS — R1031 Right lower quadrant pain: Secondary | ICD-10-CM | POA: Insufficient documentation

## 2022-10-05 DIAGNOSIS — E278 Other specified disorders of adrenal gland: Secondary | ICD-10-CM | POA: Insufficient documentation

## 2022-10-05 DIAGNOSIS — K439 Ventral hernia without obstruction or gangrene: Secondary | ICD-10-CM | POA: Insufficient documentation

## 2022-10-05 LAB — CBC WITH DIFFERENTIAL/PLATELET
Abs Immature Granulocytes: 0.02 10*3/uL (ref 0.00–0.07)
Basophils Absolute: 0 10*3/uL (ref 0.0–0.1)
Basophils Relative: 1 %
Eosinophils Absolute: 0.1 10*3/uL (ref 0.0–0.5)
Eosinophils Relative: 2 %
HCT: 41.4 % (ref 39.0–52.0)
Hemoglobin: 13.4 g/dL (ref 13.0–17.0)
Immature Granulocytes: 0 %
Lymphocytes Relative: 28 %
Lymphs Abs: 1.9 10*3/uL (ref 0.7–4.0)
MCH: 28.8 pg (ref 26.0–34.0)
MCHC: 32.4 g/dL (ref 30.0–36.0)
MCV: 89 fL (ref 80.0–100.0)
Monocytes Absolute: 0.7 10*3/uL (ref 0.1–1.0)
Monocytes Relative: 10 %
Neutro Abs: 4 10*3/uL (ref 1.7–7.7)
Neutrophils Relative %: 59 %
Platelets: 194 10*3/uL (ref 150–400)
RBC: 4.65 MIL/uL (ref 4.22–5.81)
RDW: 14.1 % (ref 11.5–15.5)
WBC: 6.6 10*3/uL (ref 4.0–10.5)
nRBC: 0 % (ref 0.0–0.2)

## 2022-10-05 LAB — BASIC METABOLIC PANEL
Anion gap: 6 (ref 5–15)
BUN: 20 mg/dL (ref 8–23)
CO2: 23 mmol/L (ref 22–32)
Calcium: 9.1 mg/dL (ref 8.9–10.3)
Chloride: 107 mmol/L (ref 98–111)
Creatinine, Ser: 1.1 mg/dL (ref 0.61–1.24)
GFR, Estimated: >60 mL/min (ref >60)
Glucose, Bld: 146 mg/dL — ABNORMAL HIGH (ref 70–99)
Potassium: 4.3 mmol/L (ref 3.5–5.1)
Sodium: 136 mmol/L (ref 135–145)

## 2022-10-05 MED ORDER — IOHEXOL 300 MG/ML  SOLN
100.0000 mL | Freq: Once | INTRAMUSCULAR | Status: AC | PRN
  Administered 2022-10-05: 100 mL via INTRAVENOUS

## 2022-10-05 MED ORDER — TRAMADOL HCL 50 MG PO TABS: 50.0000 mg | ORAL_TABLET | Freq: Three times a day (TID) | ORAL | 0 refills | Status: DC | PRN

## 2022-10-05 NOTE — Discharge Instructions (Signed)
Your CT report is included in your papers.  [facility]see [facility] and for your hernia surgery.

## 2022-10-05 NOTE — ED Provider Notes (Signed)
[place] EMERGENCY DEPARTMENT AT [facility] Provider Note   CSN: [id number] Arrival date & time: 10/05/22  1053     History  Chief Complaint  Patient presents with   Hernia    Willie Harris is a 87 y.o. male presenting emergency prior complaining of lower abdominal pain.  The patient was diagnosed with a fat-containing inguinal hernia on ED CT scan approxi-1 month ago.  He is frustrated because the [place] hospital that he follows that has delayed his surgery, and now currently scheduled  "In two weeks."  He reports worsening abdominal pain.  Mild nausea.  He is still moving his bowels.  He says the hernia can become very hard and firm at times, and he "just cannot deal with it anymore".  He lives by himself.  He presents to the ED today because he reports that [facility] to come to outside hospital for expedited treatment.  He is on eliquis  HPI     Home Medications Prior to Admission medications   Medication Sig Start Date End Date Taking? Authorizing Provider  traMADol (ULTRAM) 50 MG tablet Take 1 tablet (50 mg total) by mouth every 8 (eight) hours as needed for up to 12 doses. 10/05/22  Yes Herb Beltre, Kermit Balo, MD  albuterol (PROVENTIL HFA;VENTOLIN HFA) 108 (90 Base) MCG/ACT inhaler Inhale 1-2 puffs into the lungs every 6 (six) hours as needed for wheezing or shortness of breath. 03/23/18   Alene Mires, NP  amLODipine (NORVASC) 2.5 MG tablet Take 2.5 mg by mouth daily.    [provider]  apixaban (ELIQUIS) 5 MG TABS tablet Take 5 mg by mouth 2 (two) times daily.    [provider]  aspirin EC 81 MG tablet Take 81 mg by mouth daily.    [provider]  budesonide-formoterol (SYMBICORT) 160-4.5 MCG/ACT inhaler Inhale 2 puffs into the lungs 2 (two) times daily.    [provider]  clonazePAM (KLONOPIN) 1 MG tablet Take 0.5 tablets (0.5 mg total) by mouth 2 (two) times daily. 02/04/14   Arthor Captain, PA-C   dronabinol (MARINOL) 5 MG capsule Take 1 capsule (5 mg total) by mouth daily before lunch. 01/12/17   Arnaldo Natal, MD  hydrOXYzine (ATARAX/VISTARIL) 10 MG tablet Take 10 mg by mouth at bedtime as needed for anxiety (for sleep).    [provider]  meclizine (ANTIVERT) 25 MG tablet Take 0.5 tablets (12.5 mg total) by mouth 3 (three) times daily as needed for dizziness. 12/04/20   Milagros Loll, MD  omeprazole (PRILOSEC) 40 MG capsule Take 40 mg by mouth 2 (two) times daily. 04/03/22   [provider]  oxyCODONE-acetaminophen (PERCOCET/ROXICET) 5-325 MG tablet Take 1 tablet by mouth every 6 (six) hours as needed for severe pain. 09/05/22   Ernie Avena, MD  pravastatin (PRAVACHOL) 40 MG tablet Take 40 mg by mouth daily.    [provider]      Allergies    Sulfa antibiotics    Review of Systems   Review of Systems  Physical Exam Updated Vital Signs BP (!) 143/81   Pulse 78   Temp 98.2 F (36.8 C) (Oral)   Resp 18   Ht 5\' 11"  (1.803 m)   Wt 76.7 kg   SpO2 96%   BMI 23.58 kg/m  Physical Exam Constitutional:      General: He is not in acute distress. HENT:     Head: Normocephalic and atraumatic.  Eyes:  Conjunctiva/sclera: Conjunctivae normal.     Pupils: Pupils are equal, round, and reactive to light.  Cardiovascular:     Rate and Rhythm: Normal rate and regular rhythm.  Pulmonary:     Effort: Pulmonary effort is normal. No respiratory distress.  Abdominal:     General: There is no distension.     Comments: Moderately tender, reducible right inguinal hernia, without overlying skin changes  Skin:    General: Skin is warm and dry.  Neurological:     General: No focal deficit present.     Mental Status: He is alert. Mental status is at baseline.  Psychiatric:        Mood and Affect: Mood normal.        Behavior: Behavior normal.     ED Results / Procedures / Treatments   Labs (all labs ordered are listed, but only abnormal  results are displayed) Labs Reviewed  BASIC METABOLIC PANEL - Abnormal; Notable for the following components:      Result Value   Glucose, Bld 146 (*)    All other components within normal limits  CBC WITH DIFFERENTIAL/PLATELET  URINALYSIS, ROUTINE W REFLEX MICROSCOPIC    EKG None  Radiology CT ABDOMEN PELVIS W CONTRAST  Result Date: 10/05/2022 CLINICAL DATA:  Hernia pain. EXAM: CT ABDOMEN AND PELVIS WITH CONTRAST TECHNIQUE: Multidetector CT imaging of the abdomen and pelvis was performed using the standard protocol following bolus administration of intravenous contrast. RADIATION DOSE REDUCTION: This exam was performed according to the departmental dose-optimization program which includes automated exposure control, adjustment of the mA and/or kV according to patient size and/or use of iterative reconstruction technique. CONTRAST:  OMNIPAQUE IOHEXOL 300 MG/ML  SOLN COMPARISON:  CT 09/05/2022 FINDINGS: Lower chest: Lower lobe bronchiectasis identified with bronchial wall thickening and some mucous plugging, left-greater-than-right. No pleural effusion. There is some linear opacity as well likely scar or atelectasis. Coronary artery calcifications are noted. Hepatobiliary: Fatty liver infiltration. There are a few peripheral areas of nodular hypervascularity which is likely related to shunting. Few tiny low-attenuation lesions as well elsewhere, too small to completely characterize but likely benign hepatic cystic lesions. No specific imaging follow-up. Pancreas: Unremarkable. No pancreatic ductal dilatation or surrounding inflammatory changes. Spleen: Spleen is nonenlarged.  Multiple splenic granulomas. Adrenals/Urinary Tract: Right adrenal gland is preserved. There is a nodule in the inferior aspect of the left adrenal gland measuring 9 mm. Again not clearly an adenoma. No collecting system dilatation. There are some small Bosniak 1 and 2 renal cystic foci identified bilaterally, unchanged  from previous. No specific imaging follow-up. No collecting system dilatation. The ureters have normal course and caliber extending down to the bladder. Preserved contours of the urinary bladder. Stomach/Bowel: Large bowel on this non oral contrast examination has a normal course and caliber with mild-to-moderate colonic stool. No obstruction. Left-sided colonic diverticula. Normal appendix extends medial to the cecum in the right lower quadrant. The stomach has gastric fold thickening proximally and to the midbody. Adjacent vascular congestion. Please correlate for any symptoms including for gastritis. Small bowel is nondilated. Vascular/Lymphatic: Aortic atherosclerosis. No enlarged abdominal or pelvic lymph nodes. Reproductive: Mildly enlarged prostate.  Heterogeneous prostate. Other: No free air or free fluid. Small fat containing umbilical hernia. Slight thickening along the right inguinal canal. Musculoskeletal: Moderate degenerative changes of the spine. Trace anterolisthesis of L4-5 and retrolisthesis of L5 on S1. Mild disc bulging. IMPRESSION: Moderate colonic stool. No obstruction, free air or free fluid. Normal appendix. Fatty liver infiltration. Stable  left adrenal nodule, indeterminate based on appearance. Dedicated workup or short follow-up is recommended when appropriate. Heterogeneous prostate.  Please correlate with patient's PSA. Slight thickening along the right inguinal canal. Electronically Signed   By: Karen Kays M.D.   On: 10/05/2022 13:00    Procedures Procedures    Medications Ordered in ED Medications  iohexol (OMNIPAQUE) 300 MG/ML solution 100 mL (100 mLs Intravenous Contrast Given 10/05/22 1235)    ED Course/ Medical Decision Making/ A&P Clinical Course as of 10/05/22 1755  Thu Oct 05, 2022  1357 Patient was reassessed and remains comfortable in the room.  There are no emergent findings on CT imaging. [MT]  1357 He is asking for general surgery referral outside the [place] and  I will provide him with the on-call provider information.  Otherwise he will keep his current appointment [facility]  We discussed how to apply ice and lay flat to relieve pressure when he is having inguinal pain.  He verbalized understanding.  I will provide him a short-term extension to his tramadol medication as well. [MT]  1358 We also discussed incidental finding of the adrenal mass noted on CT. [MT]    Clinical Course User Index [MT] Swan [place], Kermit Balo, MD                             Medical Decision Making Amount and/or Complexity of Data Reviewed Labs: ordered. Radiology: ordered.  Risk Prescription drug management.   This patient presents to the ED with concern for lower abdominal pain. This involves an extensive number of treatment options, and is a complaint that carries with it a high risk of complications and morbidity.  The differential diagnosis includes hernia pain versus muscle strain versus other intra-abdominal process  External records from outside source obtained and reviewed including CT abdomen 5/21: MPRESSION: Diffuse colonic stool. No bowel obstruction, free air or free fluid. Normal appendix.   Small fat containing right inguinal hernia.   Mild fatty liver infiltration.   Indeterminate left-sided small left adrenal nodule. Please correlate with any prior to assess stability or further workup when clinically appropriate with the other washout CT or MRI.   Enlarged prostate with mass effect along the base of the bladder. There is bladder wall thickening identified as well. Please correlate with patient's PSA    I ordered and personally interpreted labs.  The pertinent results include:  no emergent findings  I ordered imaging studies including CT abdomen pelvis with contrast I independently visualized and interpreted imaging which showed no emergent findings; incidental adrenal nodule noted I agree  with the radiologist interpretation  I have reviewed the patients home medicines and have made adjustments as needed  Test Considered: doubt ovarian torsion; no ultrasound indicated.  Doubt UTI, DVT, AAA, vascular emergency.  Dispostion:  After consideration of the diagnostic results and the patients response to treatment, I feel that the patent would benefit from outpatient surgery follow up..         Final Clinical Impression(s) / ED Diagnoses Final diagnoses:  Right inguinal pain  Ventral hernia without obstruction or gangrene  Adrenal nodule (HCC)  Enlarged prostate    Rx / DC Orders ED Discharge Orders          Ordered    traMADol (ULTRAM) 50 MG tablet  Every 8 hours PRN        10/05/22  1404              Terald Sleeper, MD 10/05/22 1755

## 2022-10-05 NOTE — ED Triage Notes (Signed)
Patient brought in by EMS due to hernia pain. Patient was seen for the same issue last month. Has hernia surgery scheduled for 7/5. Pt denies any changes in the pain, states he just needs his hernia fixed.

## 2023-01-10 ENCOUNTER — Inpatient Hospital Stay
Admission: RE | Admit: 2023-01-10 | Discharge: 2023-01-10 | Disposition: A | Discharge status: Home / Self Care | Payer: Self-pay | Source: Ambulatory Visit | Attending: Radiation Oncology | Admitting: Radiation Oncology

## 2023-01-10 ENCOUNTER — Other Ambulatory Visit: Payer: Self-pay | Admitting: Radiation Oncology

## 2023-01-10 ENCOUNTER — Other Ambulatory Visit (HOSPITAL_COMMUNITY): Payer: Self-pay | Admitting: Student

## 2023-01-10 DIAGNOSIS — C61 Malignant neoplasm of prostate: Secondary | ICD-10-CM

## 2023-01-19 ENCOUNTER — Encounter (HOSPITAL_COMMUNITY)
Admission: RE | Admit: 2023-01-19 | Discharge: 2023-01-19 | Disposition: A | Discharge status: Home / Self Care | Payer: No Typology Code available for payment source | Source: Ambulatory Visit | Attending: Student | Admitting: Student

## 2023-01-19 DIAGNOSIS — C61 Malignant neoplasm of prostate: Secondary | ICD-10-CM | POA: Insufficient documentation

## 2023-01-19 MED ORDER — FLOTUFOLASTAT F 18 GALLIUM 296-5846 MBQ/ML IV SOLN
8.6000 | Freq: Once | INTRAVENOUS | Status: AC
  Administered 2023-01-19: 8.6 via INTRAVENOUS

## 2023-01-29 ENCOUNTER — Encounter: Payer: Self-pay | Admitting: Urology

## 2023-01-29 DIAGNOSIS — C61 Malignant neoplasm of prostate: Secondary | ICD-10-CM | POA: Insufficient documentation

## 2023-01-29 NOTE — Progress Notes (Signed)
GU Location of Tumor / Histology: Prostate Ca  If Prostate Cancer, Gleason Score is (3 + 4) and PSA is (16.8 on 06/02/2022)  Biopsy     01/19/2023 Dr. Ritta Slot NM PET (PSMA) Skull to Mid Thigh CLINICAL DATA:  Prostate carcinoma staging.   IMPRESSION: 1. Heterogeneous radiotracer uptake in an enlarged prostate gland with a dominant focus of radiotracer avidity on the left, compatible with known prostate carcinoma. 2. No evidence of radiotracer avid metastatic disease. 3. Aortic Atherosclerosis (ICD10-I70.0).   Past/Anticipated interventions by urology, if any: NA  Past/Anticipated interventions by medical oncology, if any: NA  Weight changes, if any:  Weight loss of 20 lbs during hospital visit for hernia surgery this past April unable to eat food.  IPSS:  20 SHIM:  12  Bowel/Bladder complaints, if any:  Frequent urination not as bad as it use to be per patient.  No bowel issues at this time.  Nausea/Vomiting, if any: No  Pain issues, if any:  0/10  SAFETY ISSUES: Prior radiation?  No Pacemaker/ICD? No Possible current pregnancy? Male Is the patient on methotrexate? No  Current Complaints / other details:  C-pap machine doesn't use often.

## 2023-01-29 NOTE — Progress Notes (Signed)
Radiation Oncology         [phone] ________________________________  Initial Outpatient Consultation  Name: Willie Harris MRN: [id number]  Date: 01/30/2023  DOB: 1935/12/13  IE:PPIRJJ, Thurmon Fair, DO   REFERRING PHYSICIAN: Ritta Slot, DO  DIAGNOSIS: 87 y.o. gentleman with Stage T2b adenocarcinoma of the prostate with Gleason score of 3+4, and PSA of 16.8.    ICD-10-CM   1. Malignant neoplasm of prostate (HCC)  C61       HISTORY OF PRESENT ILLNESS: Willie Harris is a 87 y.o. male with a diagnosis of prostate cancer. He has a history of an elevated PSA dating back to 06/2014, at which time it was 5.48. When his PSA was rechecked in 06/2020, it had increased to 15.2 and continued to rise to 19.1 by 03/2022 but decreased slightly to 16.8 in 05/2022. He underwent prostate MRI on 08/10/22 showing a 2.6 cm lesion at the right posterior lateral/anterior of the peripheral zone at the base, with involvement of the prostatic capsule and questionable involvement of the right neurovascular bundle (PI-RADS 5). Additionally, there was a 1.4 cm lesion in  in the posterolateral left peripheral zone at the base (PI-RADS 5); a 1.1 cm lesion in the anterior left peripheral zone in the mid gland (PI-RADS 4) and a 0.5 cm lesion in the posterolateral right peripheral zone at the apex (PI-RADS 4). The patient proceeded to transrectal ultrasound with 12 biopsies of the prostate on 12/05/22 under the care of Dr. Gala Lewandowsky, digital rectal examination performed at that time showed no nodules or induration.  The prostate volume measured 51 cc.  Out of 14 core biopsies, 6 were positive.  The maximum Gleason score was 3+4, and this was seen in both cores from the left apex. A Gleason score was unable to be reliably assigned to the remaining positive cores but per the pathologist's note, there was limited tumor that varied between Gleason patterns 3 and 4.  He was referred to Dr. Loistine Simas  in medical oncology at the [name] on 01/01/23. He underwent staging PSMA PET scan on 01/19/23 showing no evidence of disease outside of the prostate.  The patient reviewed the biopsy and imaging results with his urologist and he has kindly been referred today for discussion of potential radiation treatment options.   PREVIOUS RADIATION THERAPY: No  PAST MEDICAL HISTORY:  Past Medical History:  Diagnosis Date   Anxiety    Atrial flutter (HCC)    [facility]   Cataract    bilateral   COPD (chronic obstructive pulmonary disease) (HCC)    Diabetes mellitus type 2, noninsulin dependent (HCC)    Elevated PSA    GERD (gastroesophageal reflux disease)    HLD (hyperlipidemia)    HTN (hypertension)    OA (osteoarthritis)    OSA (obstructive sleep apnea)    has CPAP, not currently using it   TIA (transient ischemic attack)       PAST SURGICAL HISTORY: Past Surgical History:  Procedure Laterality Date   KNEE ARTHROPLASTY Right    PROSTATE BIOPSY      FAMILY HISTORY:  Family History  Problem Relation Age of Onset   Diabetes Mother    Diabetes Father    Diabetes Sister     SOCIAL HISTORY: He still lives independently but has a son that lives nearby and is very involved in his care. Social History   Socioeconomic History   Marital status: Divorced    Spouse name: Not on  file   Number of children: Not on file   Years of education: Not on file   Highest education level: Not on file  Occupational History   Not on file  Tobacco Use   Smoking status: Former    Current packs/day: 0.00    Types: Cigarettes    Quit date: 05/09/2013    Years since quitting: 9.7   Smokeless tobacco: Never  Vaping Use   Vaping status: Never Used  Substance and Sexual Activity   Alcohol use: Not Currently   Drug use: Yes    Types: Marijuana    Comment: occas   Sexual activity: Not Currently  Other Topics Concern   Not on file  Social History Narrative   Not on file   Social Determinants of  Health   Financial Resource Strain: Not on file  Food Insecurity: Food Insecurity Present (01/30/2023)   Hunger Vital Sign    Worried About Running Out of Food in the Last Year: Sometimes true    Ran Out of Food in the Last Year: Sometimes true  Transportation Needs: No Transportation Needs (01/30/2023)   PRAPARE - Administrator, Civil Service (Medical): No    Lack of Transportation (Non-Medical): No  Physical Activity: Not on file  Stress: Not on file  Social Connections: Unknown (09/01/2022)   Received from [facility]   Social Network    Social Network: Not on file  Intimate Partner Violence: Not At Risk (01/30/2023)   Humiliation, Afraid, Rape, and Kick questionnaire    Fear of Current or Ex-Partner: No    Emotionally Abused: No    Physically Abused: No    Sexually Abused: No    ALLERGIES: Doxycycline and Sulfa antibiotics  MEDICATIONS:  Current Outpatient Medications  Medication Sig Dispense Refill   amLODipine (NORVASC) 5 MG tablet Take 5 mg by mouth daily.     Continuous Glucose Sensor (FREESTYLE LIBRE 2 SENSOR) MISC AS DIRECTED THREE TIMES DAILY AND AS NEEDED FOR GLUCOSE TESTING     cycloSPORINE (RESTASIS) 0.05 % ophthalmic emulsion Apply to eye.     diclofenac Sodium (VOLTAREN) 1 % GEL Apply topically.     gabapentin (NEURONTIN) 100 MG capsule Take by mouth.     melatonin 3 MG TABS tablet Take 3 mg by mouth at bedtime. Take 3 tablets (9 mg) by mouth at bedtime for Insomnia.     Multiple Vitamins-Minerals (PRESERVISION AREDS 2+MULTI VIT) CAPS Take 1 capsule by mouth 2 (two) times daily.     pantoprazole (PROTONIX) 40 MG tablet Take by mouth.     PSYLLIUM FIBER PO Take by mouth.     SITagliptin 50 MG TABS Take by mouth.     tadalafil (CIALIS) 20 MG tablet Take 20 mg by mouth.     tamsulosin (FLOMAX) 0.4 MG CAPS capsule Take by mouth.     albuterol (PROVENTIL HFA;VENTOLIN HFA) 108 (90 Base) MCG/ACT inhaler Inhale 1-2 puffs into the lungs  every 6 (six) hours as needed for wheezing or shortness of breath. 1 Inhaler 4   budesonide-formoterol (SYMBICORT) 160-4.5 MCG/ACT inhaler Inhale 2 puffs into the lungs 2 (two) times daily.     clonazePAM (KLONOPIN) 1 MG tablet Take 0.5 tablets (0.5 mg total) by mouth 2 (two) times daily. 15 tablet 0   meclizine (ANTIVERT) 25 MG tablet Take 0.5 tablets (12.5 mg total) by mouth 3 (three) times daily as needed for dizziness. 20 tablet 0   pravastatin (PRAVACHOL) 40 MG tablet Take  40 mg by mouth daily.     No current facility-administered medications for this encounter.    REVIEW OF SYSTEMS:  On review of systems, the patient reports that he is doing well overall. He denies any chest pain, shortness of breath, cough, fevers, chills, night sweats, unintended weight changes. He denies any bowel disturbances, and denies abdominal pain, nausea or vomiting. He denies any new musculoskeletal or joint aches or pains. His IPSS was 20, indicating moderate-severe urinary symptoms with hesitancy, intermittency, incomplete emptying and nocturia x3 despite taking Flomax daily as prescribed. His SHIM was 12, indicating he has moderate erectile dysfunction. A complete review of systems is obtained and is otherwise negative.    PHYSICAL EXAM:  Wt Readings from Last 3 Encounters:  01/30/23 166 lb 8 oz (75.5 kg)  10/05/22 169 lb 1.5 oz (76.7 kg)  09/05/22 169 lb (76.7 kg)   Temp Readings from Last 3 Encounters:  01/30/23 (!) 97.5 F (36.4 C) (Temporal)  10/05/22 98.2 F (36.8 C) (Oral)  09/05/22 (!) 97.5 F (36.4 C) (Oral)   BP Readings from Last 3 Encounters:  01/30/23 121/77  10/05/22 (!) 143/81  09/05/22 136/83   Pulse Readings from Last 3 Encounters:  01/30/23 66  10/05/22 78  09/05/22 (!) 57   Pain Assessment Pain Score: 0-No pain/10  In general this is a well appearing African American man in no acute distress. He's alert and oriented x4 and appropriate throughout the examination.  Cardiopulmonary assessment is negative for acute distress and he exhibits normal effort.   KPS = 100  100 - Normal; no complaints; no evidence of disease. 90   - Able to carry on normal activity; minor signs or symptoms of disease. 80   - Normal activity with effort; some signs or symptoms of disease. 54   - Cares for self; unable to carry on normal activity or to do active work. 60   - Requires occasional assistance, but is able to care for most of his personal needs. 50   - Requires considerable assistance and frequent medical care. 40   - Disabled; requires special care and assistance. 30   - Severely disabled; hospital admission is indicated although death not imminent. 20   - Very sick; hospital admission necessary; active supportive treatment necessary. 10   - Moribund; fatal processes progressing rapidly. 0     - Dead  Karnofsky DA, Abelmann WH, Craver LS and Burchenal [facility] [phone]) The use of the nitrogen mustards in the palliative treatment of carcinoma: with particular reference to bronchogenic carcinoma Cancer 1 634-56  LABORATORY DATA:  Lab Results  Component Value Date   WBC 6.6 10/05/2022   HGB 13.4 10/05/2022   HCT 41.4 10/05/2022   MCV 89.0 10/05/2022   PLT 194 10/05/2022   Lab Results  Component Value Date   NA 136 10/05/2022   K 4.3 10/05/2022   CL 107 10/05/2022   CO2 23 10/05/2022   Lab Results  Component Value Date   ALT 20 09/05/2022   AST 24 09/05/2022   ALKPHOS 58 09/05/2022   BILITOT 0.8 09/05/2022     RADIOGRAPHY: NM PET (PSMA) SKULL TO MID THIGH  Result Date: 01/21/2023 CLINICAL DATA:  Prostate carcinoma staging. EXAM: NUCLEAR MEDICINE PET SKULL BASE TO THIGH TECHNIQUE: 8.6 mCi Flotufolastat (Posluma) was injected intravenously. Full-ring PET imaging was performed from the skull base to thigh after the radiotracer. CT data was obtained and used for attenuation correction and anatomic localization. COMPARISON:  Multiple priors  including CT October 05, 2022 and MRI January 10, 2023 FINDINGS: NECK No radiotracer activity in neck lymph nodes. Incidental CT finding: None. CHEST No radiotracer accumulation within mediastinal or hilar lymph nodes. No radiotracer avid pulmonary nodules or masses. Incidental CT finding: Emphysema. Calcified mediastinal and hilar lymph nodes aortic atherosclerosis. Coronary artery calcifications. ABDOMEN/PELVIS Prostate: Heterogeneous radiotracer uptake in an enlarged prostate gland with a dominant focus of radiotracer avidity on the left demonstrating a max SUV of 7.7. Lymph nodes: No abnormal radiotracer accumulation within pelvic or abdominal nodes. Liver: No evidence of liver metastasis. Incidental CT finding: Splenic granulomata. Aortic atherosclerosis. Colonic diverticulosis. Symmetric wall thickening of a nondistended urinary bladder. SKELETON No focal activity to suggest skeletal metastasis. Incidental CT findings: Multilevel degenerative change of the spine with multifocal degenerative joint disease. IMPRESSION: 1. Heterogeneous radiotracer uptake in an enlarged prostate gland with a dominant focus of radiotracer avidity on the left, compatible with known prostate carcinoma. 2. No evidence of radiotracer avid metastatic disease. 3. Aortic Atherosclerosis (ICD10-I70.0). Electronically Signed   By: Maudry Mayhew M.D.   On: 01/21/2023 11:43      IMPRESSION/PLAN:  1. 87 y.o. gentleman with Stage T2c adenocarcinoma of the prostate with Gleason Score of 3+4, and PSA of 16.8. We discussed the patient's workup and outlined the nature of prostate cancer in this setting. The patient's T stage, Gleason's score, and PSA put him into the intermediate risk group. Accordingly, he is eligible for a variety of potential treatment options including brachytherapy, 5.5-8 weeks of external radiation, or prostatectomy. We discussed the available radiation techniques, and focused on the details and logistics and delivery. We discussed and  outlined the risks, benefits, short and long-term effects associated with radiotherapy and compared and contrasted these with prostatectomy. We discussed the role of SpaceOAR in reducing the rectal toxicity associated with radiotherapy.  He appears to have a good understanding of his disease and our treatment recommendations which are of curative intent.  He was encouraged to ask questions that were answered to his stated satisfaction.  At the end of the conversation the patient is interested in moving forward with 5.5 weeks of external beam therapy. We will share our discussion with his team at the [facility] and move forward with treatment planning accordingly. The patient appears to have a good understanding of his disease and our treatment recommendations which are of curative intent and is in agreement with the stated plan.  He has freely signed written consent to proceed today in the office and a copy of this document will be placed in his medical record.  He is tentatively scheduled for CT simulation at 10 AM on Friday, 02/02/2023, in anticipation of beginning IMRT in the near future.  We enjoyed meeting him and his son today and look forward to continuing   We personally spent 70 minutes in this encounter including chart review, reviewing radiological studies, meeting face-to-face with the patient, entering orders and completing documentation.     Marguarite Arbour, PA-C    Margaretmary Dys, MD  [facility]  Radiation Oncology Direct Dial: [phone]  Fax: [phone] [email or web address]  Skype  LinkedIn   This document serves as a record of services personally performed by Margaretmary Dys, MD and Marcello Fennel, PA-C. It was created on their behalf by Mickie Bail, a trained medical scribe. The creation of this record is based on the scribe's personal observations and the provider's statements to them. This document has been checked and approved by the attending provider.

## 2023-01-30 ENCOUNTER — Ambulatory Visit
Admission: RE | Admit: 2023-01-30 | Discharge: 2023-01-30 | Disposition: A | Discharge status: Home / Self Care | Payer: No Typology Code available for payment source | Source: Ambulatory Visit | Attending: Radiation Oncology | Admitting: Radiation Oncology

## 2023-01-30 ENCOUNTER — Encounter: Payer: Self-pay | Admitting: Radiation Oncology

## 2023-01-30 VITALS — BP 121/77 | HR 66 | Temp 97.5°F | Resp 18 | Ht 71.0 in | Wt 166.5 lb

## 2023-01-30 DIAGNOSIS — I10 Essential (primary) hypertension: Secondary | ICD-10-CM | POA: Diagnosis not present

## 2023-01-30 DIAGNOSIS — G4730 Sleep apnea, unspecified: Secondary | ICD-10-CM | POA: Insufficient documentation

## 2023-01-30 DIAGNOSIS — C61 Malignant neoplasm of prostate: Secondary | ICD-10-CM | POA: Diagnosis present

## 2023-01-30 DIAGNOSIS — E785 Hyperlipidemia, unspecified: Secondary | ICD-10-CM | POA: Insufficient documentation

## 2023-01-30 DIAGNOSIS — Z7951 Long term (current) use of inhaled steroids: Secondary | ICD-10-CM | POA: Insufficient documentation

## 2023-01-30 DIAGNOSIS — I700 Atherosclerosis of aorta: Secondary | ICD-10-CM | POA: Insufficient documentation

## 2023-01-30 DIAGNOSIS — Z8673 Personal history of transient ischemic attack (TIA), and cerebral infarction without residual deficits: Secondary | ICD-10-CM | POA: Diagnosis not present

## 2023-01-30 DIAGNOSIS — M1990 Unspecified osteoarthritis, unspecified site: Secondary | ICD-10-CM | POA: Insufficient documentation

## 2023-01-30 DIAGNOSIS — Z79899 Other long term (current) drug therapy: Secondary | ICD-10-CM | POA: Insufficient documentation

## 2023-01-30 DIAGNOSIS — K5730 Diverticulosis of large intestine without perforation or abscess without bleeding: Secondary | ICD-10-CM | POA: Diagnosis not present

## 2023-01-30 DIAGNOSIS — J449 Chronic obstructive pulmonary disease, unspecified: Secondary | ICD-10-CM | POA: Insufficient documentation

## 2023-01-30 DIAGNOSIS — J439 Emphysema, unspecified: Secondary | ICD-10-CM | POA: Insufficient documentation

## 2023-01-30 DIAGNOSIS — Z87891 Personal history of nicotine dependence: Secondary | ICD-10-CM | POA: Insufficient documentation

## 2023-01-30 DIAGNOSIS — K219 Gastro-esophageal reflux disease without esophagitis: Secondary | ICD-10-CM | POA: Insufficient documentation

## 2023-01-30 DIAGNOSIS — E119 Type 2 diabetes mellitus without complications: Secondary | ICD-10-CM | POA: Diagnosis not present

## 2023-01-30 DIAGNOSIS — I4892 Unspecified atrial flutter: Secondary | ICD-10-CM | POA: Diagnosis present

## 2023-01-30 NOTE — Progress Notes (Signed)
Introduced myself to the patient, and his son, as the prostate nurse navigator.  He is here to discuss his radiation treatment options and plans to proceed with daily radiation.  Patient verbalized he could benefit from transportation services and this referral has been sent.  Patient is set up for CT Simulation on 10/18.  I gave him my business card and asked him to call me with questions or concerns.  Verbalized understanding.

## 2023-02-02 ENCOUNTER — Inpatient Hospital Stay: Payer: No Typology Code available for payment source | Attending: Radiation Oncology

## 2023-02-02 ENCOUNTER — Ambulatory Visit
Admission: RE | Admit: 2023-02-02 | Discharge: 2023-02-02 | Disposition: A | Discharge status: Home / Self Care | Payer: No Typology Code available for payment source | Source: Ambulatory Visit | Attending: Radiation Oncology | Admitting: Radiation Oncology

## 2023-02-02 DIAGNOSIS — C61 Malignant neoplasm of prostate: Secondary | ICD-10-CM | POA: Insufficient documentation

## 2023-02-02 DIAGNOSIS — Z510 Encounter for antineoplastic radiation therapy: Secondary | ICD-10-CM | POA: Diagnosis present

## 2023-02-06 NOTE — Progress Notes (Signed)
Radiation Oncology         [phone] ________________________________  Name: RAISHAWN NAZAIRE MRN: [id number]  Date: 02/02/2023  DOB: 09-Nov-1935  SIMULATION AND TREATMENT PLANNING NOTE    ICD-10-CM   1. Malignant neoplasm of prostate (HCC)  C61       DIAGNOSIS:   87 y.o. gentleman with Stage T2b adenocarcinoma of the prostate with Gleason score of 3+4, and PSA of 16.8.  NARRATIVE:  The patient was brought to the CT Simulation planning suite.  Identity was confirmed.  All relevant records and images related to the planned course of therapy were reviewed.  The patient freely provided informed written consent to proceed with treatment after reviewing the details related to the planned course of therapy. The consent form was witnessed and verified by the simulation staff.  Then, the patient was set-up in a stable reproducible supine position for radiation therapy.  A vacuum lock pillow device was custom fabricated to position his legs in a reproducible immobilized position.  Then, supervised the performance of a urethrogram under sterile conditions to identify the prostatic apex.  CT images were obtained.  Surface markings were placed.  The CT images were loaded into the planning software.  Then the prostate target and avoidance structures including the rectum, bladder, bowel and hips were contoured.  Treatment planning then occurred.  The radiation prescription was entered and confirmed.  A total of one complex treatment devices was fabricated. I have requested : Intensity Modulated Radiotherapy (IMRT) is medically necessary for this case for the following reason:  Rectal sparing.  I have requested daily cone beam CT volumetric image gudiance to track gold fiducial posiitoning along with bladder and rectal filling, this is medically necessary to assure accurate positioning of high dose radiation.  PLAN:  The patient will receive 70 Gy in 28  fractions.  ________________________________  Artist Pais Kathrynn Running, M.D.

## 2023-02-12 DIAGNOSIS — Z510 Encounter for antineoplastic radiation therapy: Secondary | ICD-10-CM | POA: Diagnosis not present

## 2023-02-19 ENCOUNTER — Other Ambulatory Visit: Payer: Self-pay

## 2023-02-19 ENCOUNTER — Telehealth: Payer: Self-pay | Admitting: Radiation Oncology

## 2023-02-19 ENCOUNTER — Ambulatory Visit
Admission: RE | Admit: 2023-02-19 | Discharge: 2023-02-19 | Disposition: A | Discharge status: Home / Self Care | Payer: No Typology Code available for payment source | Source: Ambulatory Visit | Attending: Radiation Oncology | Admitting: Radiation Oncology

## 2023-02-19 DIAGNOSIS — C61 Malignant neoplasm of prostate: Secondary | ICD-10-CM | POA: Diagnosis not present

## 2023-02-19 DIAGNOSIS — Z510 Encounter for antineoplastic radiation therapy: Secondary | ICD-10-CM | POA: Insufficient documentation

## 2023-02-19 LAB — RAD ONC ARIA SESSION SUMMARY
Course Elapsed Days: 0
Plan Fractions Treated to Date: 1
Plan Prescribed Dose Per Fraction: 2.5 Gy
Plan Total Fractions Prescribed: 28
Plan Total Prescribed Dose: 70 Gy
Reference Point Dosage Given to Date: 2.5 Gy
Reference Point Session Dosage Given: 2.5 Gy
Session Number: 1

## 2023-02-19 NOTE — Progress Notes (Signed)
Patient was contacted by transportation coordinator, Ephriam Knuckles, to review need for transportation.    Patient has contact information for transportation if it is needed during his daily radiation treatments.

## 2023-02-19 NOTE — Telephone Encounter (Signed)
Pt called asking about directions for transportation. No transport appt noticed in appt desk. Call transferred to [employer], email sent as well to [employer], [employer], and [employer] regarding possible delay due to this.

## 2023-02-20 ENCOUNTER — Other Ambulatory Visit: Payer: Self-pay

## 2023-02-20 ENCOUNTER — Ambulatory Visit
Admission: RE | Admit: 2023-02-20 | Discharge: 2023-02-20 | Disposition: A | Discharge status: Home / Self Care | Payer: No Typology Code available for payment source | Source: Ambulatory Visit | Attending: Radiation Oncology | Admitting: Radiation Oncology

## 2023-02-20 ENCOUNTER — Inpatient Hospital Stay: Payer: No Typology Code available for payment source | Attending: Radiation Oncology

## 2023-02-20 DIAGNOSIS — Z510 Encounter for antineoplastic radiation therapy: Secondary | ICD-10-CM | POA: Diagnosis not present

## 2023-02-20 LAB — RAD ONC ARIA SESSION SUMMARY
Course Elapsed Days: 1
Plan Fractions Treated to Date: 2
Plan Prescribed Dose Per Fraction: 2.5 Gy
Plan Total Fractions Prescribed: 28
Plan Total Prescribed Dose: 70 Gy
Reference Point Dosage Given to Date: 5 Gy
Reference Point Session Dosage Given: 2.5 Gy
Session Number: 2

## 2023-02-21 ENCOUNTER — Other Ambulatory Visit: Payer: Self-pay

## 2023-02-21 ENCOUNTER — Inpatient Hospital Stay: Payer: No Typology Code available for payment source

## 2023-02-21 ENCOUNTER — Ambulatory Visit
Admission: RE | Admit: 2023-02-21 | Discharge: 2023-02-21 | Disposition: A | Discharge status: Home / Self Care | Payer: No Typology Code available for payment source | Source: Ambulatory Visit | Attending: Radiation Oncology | Admitting: Radiation Oncology

## 2023-02-21 DIAGNOSIS — Z510 Encounter for antineoplastic radiation therapy: Secondary | ICD-10-CM | POA: Diagnosis not present

## 2023-02-21 LAB — RAD ONC ARIA SESSION SUMMARY
Course Elapsed Days: 2
Plan Fractions Treated to Date: 3
Plan Prescribed Dose Per Fraction: 2.5 Gy
Plan Total Fractions Prescribed: 28
Plan Total Prescribed Dose: 70 Gy
Reference Point Dosage Given to Date: 7.5 Gy
Reference Point Session Dosage Given: 2.5 Gy
Session Number: 3

## 2023-02-22 ENCOUNTER — Ambulatory Visit
Admission: RE | Admit: 2023-02-22 | Discharge: 2023-02-22 | Disposition: A | Discharge status: Home / Self Care | Payer: No Typology Code available for payment source | Source: Ambulatory Visit | Attending: Radiation Oncology | Admitting: Radiation Oncology

## 2023-02-22 ENCOUNTER — Other Ambulatory Visit: Payer: Self-pay

## 2023-02-22 ENCOUNTER — Inpatient Hospital Stay: Payer: No Typology Code available for payment source

## 2023-02-22 DIAGNOSIS — Z510 Encounter for antineoplastic radiation therapy: Secondary | ICD-10-CM | POA: Diagnosis not present

## 2023-02-22 LAB — RAD ONC ARIA SESSION SUMMARY
Course Elapsed Days: 3
Plan Fractions Treated to Date: 4
Plan Prescribed Dose Per Fraction: 2.5 Gy
Plan Total Fractions Prescribed: 28
Plan Total Prescribed Dose: 70 Gy
Reference Point Dosage Given to Date: 10 Gy
Reference Point Session Dosage Given: 2.5 Gy
Session Number: 4

## 2023-02-23 ENCOUNTER — Inpatient Hospital Stay: Payer: No Typology Code available for payment source

## 2023-02-23 ENCOUNTER — Ambulatory Visit
Admission: RE | Admit: 2023-02-23 | Discharge: 2023-02-23 | Disposition: A | Discharge status: Home / Self Care | Payer: No Typology Code available for payment source | Source: Ambulatory Visit | Attending: Radiation Oncology

## 2023-02-23 ENCOUNTER — Other Ambulatory Visit: Payer: Self-pay

## 2023-02-23 ENCOUNTER — Ambulatory Visit
Admission: RE | Admit: 2023-02-23 | Discharge: 2023-02-23 | Disposition: A | Discharge status: Home / Self Care | Payer: No Typology Code available for payment source | Source: Ambulatory Visit | Attending: Radiation Oncology | Admitting: Radiation Oncology

## 2023-02-23 DIAGNOSIS — Z510 Encounter for antineoplastic radiation therapy: Secondary | ICD-10-CM | POA: Diagnosis not present

## 2023-02-23 LAB — RAD ONC ARIA SESSION SUMMARY
Course Elapsed Days: 4
Plan Fractions Treated to Date: 5
Plan Prescribed Dose Per Fraction: 2.5 Gy
Plan Total Fractions Prescribed: 28
Plan Total Prescribed Dose: 70 Gy
Reference Point Dosage Given to Date: 12.5 Gy
Reference Point Session Dosage Given: 2.5 Gy
Session Number: 5

## 2023-02-26 ENCOUNTER — Inpatient Hospital Stay: Payer: No Typology Code available for payment source

## 2023-02-26 ENCOUNTER — Other Ambulatory Visit: Payer: Self-pay

## 2023-02-26 ENCOUNTER — Ambulatory Visit
Admission: RE | Admit: 2023-02-26 | Discharge: 2023-02-26 | Disposition: A | Discharge status: Home / Self Care | Payer: No Typology Code available for payment source | Source: Ambulatory Visit | Attending: Radiation Oncology | Admitting: Radiation Oncology

## 2023-02-26 DIAGNOSIS — Z510 Encounter for antineoplastic radiation therapy: Secondary | ICD-10-CM | POA: Diagnosis not present

## 2023-02-26 LAB — RAD ONC ARIA SESSION SUMMARY
Course Elapsed Days: 7
Plan Fractions Treated to Date: 6
Plan Prescribed Dose Per Fraction: 2.5 Gy
Plan Total Fractions Prescribed: 28
Plan Total Prescribed Dose: 70 Gy
Reference Point Dosage Given to Date: 15 Gy
Reference Point Session Dosage Given: 2.5 Gy
Session Number: 6

## 2023-02-27 ENCOUNTER — Inpatient Hospital Stay: Payer: No Typology Code available for payment source

## 2023-02-27 ENCOUNTER — Ambulatory Visit
Admission: RE | Admit: 2023-02-27 | Discharge: 2023-02-27 | Disposition: A | Discharge status: Home / Self Care | Payer: No Typology Code available for payment source | Source: Ambulatory Visit | Attending: Radiation Oncology | Admitting: Radiation Oncology

## 2023-02-27 ENCOUNTER — Other Ambulatory Visit: Payer: Self-pay

## 2023-02-27 DIAGNOSIS — Z510 Encounter for antineoplastic radiation therapy: Secondary | ICD-10-CM | POA: Diagnosis not present

## 2023-02-27 LAB — RAD ONC ARIA SESSION SUMMARY
Course Elapsed Days: 8
Plan Fractions Treated to Date: 7
Plan Prescribed Dose Per Fraction: 2.5 Gy
Plan Total Fractions Prescribed: 28
Plan Total Prescribed Dose: 70 Gy
Reference Point Dosage Given to Date: 17.5 Gy
Reference Point Session Dosage Given: 2.5 Gy
Session Number: 7

## 2023-02-28 ENCOUNTER — Other Ambulatory Visit: Payer: Self-pay

## 2023-02-28 ENCOUNTER — Inpatient Hospital Stay: Payer: No Typology Code available for payment source

## 2023-02-28 ENCOUNTER — Ambulatory Visit
Admission: RE | Admit: 2023-02-28 | Discharge: 2023-02-28 | Disposition: A | Discharge status: Home / Self Care | Payer: No Typology Code available for payment source | Source: Ambulatory Visit | Attending: Radiation Oncology | Admitting: Radiation Oncology

## 2023-02-28 DIAGNOSIS — Z510 Encounter for antineoplastic radiation therapy: Secondary | ICD-10-CM | POA: Diagnosis not present

## 2023-02-28 LAB — RAD ONC ARIA SESSION SUMMARY
Course Elapsed Days: 9
Plan Fractions Treated to Date: 8
Plan Prescribed Dose Per Fraction: 2.5 Gy
Plan Total Fractions Prescribed: 28
Plan Total Prescribed Dose: 70 Gy
Reference Point Dosage Given to Date: 20 Gy
Reference Point Session Dosage Given: 2.5 Gy
Session Number: 8

## 2023-03-01 ENCOUNTER — Other Ambulatory Visit: Payer: Self-pay

## 2023-03-01 ENCOUNTER — Ambulatory Visit
Admission: RE | Admit: 2023-03-01 | Discharge: 2023-03-01 | Disposition: A | Discharge status: Home / Self Care | Payer: No Typology Code available for payment source | Source: Ambulatory Visit | Attending: Radiation Oncology | Admitting: Radiation Oncology

## 2023-03-01 ENCOUNTER — Inpatient Hospital Stay: Payer: No Typology Code available for payment source

## 2023-03-01 DIAGNOSIS — Z510 Encounter for antineoplastic radiation therapy: Secondary | ICD-10-CM | POA: Diagnosis not present

## 2023-03-01 LAB — RAD ONC ARIA SESSION SUMMARY
Course Elapsed Days: 10
Plan Fractions Treated to Date: 9
Plan Prescribed Dose Per Fraction: 2.5 Gy
Plan Total Fractions Prescribed: 28
Plan Total Prescribed Dose: 70 Gy
Reference Point Dosage Given to Date: 22.5 Gy
Reference Point Session Dosage Given: 2.5 Gy
Session Number: 9

## 2023-03-01 NOTE — Progress Notes (Addendum)
Willie Harris came over for talk with nurse reports feeling tired and dizziness x 1 day.  Willie Harris also reports not drinking enough water would rather drink milk instead.  Denies extreme thirst, fainting, nausea/vomiting, and decreased urination.   RN advised to increase water intake and electrolyte drink (Gatorade).  Reports eating well.  Vitals:  97.3-59-18-133/88 O2 sat 100% on room air.  Glucose check 101.  No other complaints at this time.  Reiterated the importance of drinking water as prevention of dehydration.  Willie Harris verbalized understanding.  RN advised if anything changes or worsen this evening to go to ED for evaluation.  Will see tomorrow 03/02/2023 for PUT visit with doctor.

## 2023-03-02 ENCOUNTER — Inpatient Hospital Stay: Payer: No Typology Code available for payment source

## 2023-03-02 ENCOUNTER — Ambulatory Visit
Admission: RE | Admit: 2023-03-02 | Discharge: 2023-03-02 | Disposition: A | Discharge status: Home / Self Care | Payer: No Typology Code available for payment source | Source: Ambulatory Visit | Attending: Radiation Oncology

## 2023-03-02 ENCOUNTER — Other Ambulatory Visit: Payer: Self-pay

## 2023-03-02 DIAGNOSIS — Z510 Encounter for antineoplastic radiation therapy: Secondary | ICD-10-CM | POA: Diagnosis not present

## 2023-03-02 LAB — RAD ONC ARIA SESSION SUMMARY
Course Elapsed Days: 11
Plan Fractions Treated to Date: 10
Plan Prescribed Dose Per Fraction: 2.5 Gy
Plan Total Fractions Prescribed: 28
Plan Total Prescribed Dose: 70 Gy
Reference Point Dosage Given to Date: 25 Gy
Reference Point Session Dosage Given: 2.5 Gy
Session Number: 10

## 2023-03-02 LAB — GLUCOSE, CAPILLARY: Glucose-Capillary: 101 mg/dL — ABNORMAL HIGH (ref 70–99)

## 2023-03-05 ENCOUNTER — Other Ambulatory Visit: Payer: Self-pay

## 2023-03-05 ENCOUNTER — Ambulatory Visit
Admission: RE | Admit: 2023-03-05 | Discharge: 2023-03-05 | Disposition: A | Discharge status: Home / Self Care | Payer: No Typology Code available for payment source | Source: Ambulatory Visit | Attending: Radiation Oncology

## 2023-03-05 ENCOUNTER — Inpatient Hospital Stay: Payer: No Typology Code available for payment source

## 2023-03-05 DIAGNOSIS — Z510 Encounter for antineoplastic radiation therapy: Secondary | ICD-10-CM | POA: Diagnosis not present

## 2023-03-05 LAB — RAD ONC ARIA SESSION SUMMARY
Course Elapsed Days: 14
Plan Fractions Treated to Date: 11
Plan Prescribed Dose Per Fraction: 2.5 Gy
Plan Total Fractions Prescribed: 28
Plan Total Prescribed Dose: 70 Gy
Reference Point Dosage Given to Date: 27.5 Gy
Reference Point Session Dosage Given: 2.5 Gy
Session Number: 11

## 2023-03-06 ENCOUNTER — Other Ambulatory Visit: Payer: Self-pay

## 2023-03-06 ENCOUNTER — Inpatient Hospital Stay: Payer: No Typology Code available for payment source

## 2023-03-06 ENCOUNTER — Ambulatory Visit
Admission: RE | Admit: 2023-03-06 | Discharge: 2023-03-06 | Disposition: A | Discharge status: Home / Self Care | Payer: No Typology Code available for payment source | Source: Ambulatory Visit | Attending: Radiation Oncology | Admitting: Radiation Oncology

## 2023-03-06 DIAGNOSIS — Z510 Encounter for antineoplastic radiation therapy: Secondary | ICD-10-CM | POA: Diagnosis not present

## 2023-03-06 LAB — RAD ONC ARIA SESSION SUMMARY
Course Elapsed Days: 15
Plan Fractions Treated to Date: 12
Plan Prescribed Dose Per Fraction: 2.5 Gy
Plan Total Fractions Prescribed: 28
Plan Total Prescribed Dose: 70 Gy
Reference Point Dosage Given to Date: 30 Gy
Reference Point Session Dosage Given: 2.5 Gy
Session Number: 12

## 2023-03-07 ENCOUNTER — Other Ambulatory Visit: Payer: Self-pay

## 2023-03-07 ENCOUNTER — Ambulatory Visit
Admission: RE | Admit: 2023-03-07 | Discharge: 2023-03-07 | Disposition: A | Discharge status: Home / Self Care | Payer: No Typology Code available for payment source | Source: Ambulatory Visit | Attending: Radiation Oncology

## 2023-03-07 ENCOUNTER — Inpatient Hospital Stay: Payer: No Typology Code available for payment source

## 2023-03-07 DIAGNOSIS — Z510 Encounter for antineoplastic radiation therapy: Secondary | ICD-10-CM | POA: Diagnosis not present

## 2023-03-07 LAB — RAD ONC ARIA SESSION SUMMARY
Course Elapsed Days: 16
Plan Fractions Treated to Date: 13
Plan Prescribed Dose Per Fraction: 2.5 Gy
Plan Total Fractions Prescribed: 28
Plan Total Prescribed Dose: 70 Gy
Reference Point Dosage Given to Date: 32.5 Gy
Reference Point Session Dosage Given: 2.5 Gy
Session Number: 13

## 2023-03-08 ENCOUNTER — Inpatient Hospital Stay: Payer: No Typology Code available for payment source

## 2023-03-08 ENCOUNTER — Other Ambulatory Visit: Payer: Self-pay

## 2023-03-08 ENCOUNTER — Ambulatory Visit
Admission: RE | Admit: 2023-03-08 | Discharge: 2023-03-08 | Disposition: A | Discharge status: Home / Self Care | Payer: No Typology Code available for payment source | Source: Ambulatory Visit | Attending: Radiation Oncology | Admitting: Radiation Oncology

## 2023-03-08 DIAGNOSIS — Z510 Encounter for antineoplastic radiation therapy: Secondary | ICD-10-CM | POA: Diagnosis not present

## 2023-03-08 LAB — RAD ONC ARIA SESSION SUMMARY
Course Elapsed Days: 17
Plan Fractions Treated to Date: 14
Plan Prescribed Dose Per Fraction: 2.5 Gy
Plan Total Fractions Prescribed: 28
Plan Total Prescribed Dose: 70 Gy
Reference Point Dosage Given to Date: 35 Gy
Reference Point Session Dosage Given: 2.5 Gy
Session Number: 14

## 2023-03-09 ENCOUNTER — Other Ambulatory Visit: Payer: Self-pay

## 2023-03-09 ENCOUNTER — Inpatient Hospital Stay: Payer: No Typology Code available for payment source

## 2023-03-09 ENCOUNTER — Ambulatory Visit
Admission: RE | Admit: 2023-03-09 | Discharge: 2023-03-09 | Disposition: A | Discharge status: Home / Self Care | Payer: No Typology Code available for payment source | Source: Ambulatory Visit | Attending: Radiation Oncology | Admitting: Radiation Oncology

## 2023-03-09 DIAGNOSIS — Z510 Encounter for antineoplastic radiation therapy: Secondary | ICD-10-CM | POA: Diagnosis not present

## 2023-03-09 LAB — RAD ONC ARIA SESSION SUMMARY
Course Elapsed Days: 18
Plan Fractions Treated to Date: 15
Plan Prescribed Dose Per Fraction: 2.5 Gy
Plan Total Fractions Prescribed: 28
Plan Total Prescribed Dose: 70 Gy
Reference Point Dosage Given to Date: 37.5 Gy
Reference Point Session Dosage Given: 2.5 Gy
Session Number: 15

## 2023-03-12 ENCOUNTER — Other Ambulatory Visit: Payer: Self-pay

## 2023-03-12 ENCOUNTER — Ambulatory Visit
Admission: RE | Admit: 2023-03-12 | Discharge: 2023-03-12 | Disposition: A | Discharge status: Home / Self Care | Payer: No Typology Code available for payment source | Source: Ambulatory Visit | Attending: Radiation Oncology | Admitting: Radiation Oncology

## 2023-03-12 ENCOUNTER — Inpatient Hospital Stay: Payer: No Typology Code available for payment source

## 2023-03-12 DIAGNOSIS — Z510 Encounter for antineoplastic radiation therapy: Secondary | ICD-10-CM | POA: Diagnosis not present

## 2023-03-12 LAB — RAD ONC ARIA SESSION SUMMARY
Course Elapsed Days: 21
Plan Fractions Treated to Date: 16
Plan Prescribed Dose Per Fraction: 2.5 Gy
Plan Total Fractions Prescribed: 28
Plan Total Prescribed Dose: 70 Gy
Reference Point Dosage Given to Date: 40 Gy
Reference Point Session Dosage Given: 2.5 Gy
Session Number: 16

## 2023-03-13 ENCOUNTER — Other Ambulatory Visit: Payer: Self-pay

## 2023-03-13 ENCOUNTER — Ambulatory Visit
Admission: RE | Admit: 2023-03-13 | Discharge: 2023-03-13 | Disposition: A | Discharge status: Home / Self Care | Payer: No Typology Code available for payment source | Source: Ambulatory Visit | Attending: Radiation Oncology

## 2023-03-13 ENCOUNTER — Inpatient Hospital Stay: Payer: No Typology Code available for payment source

## 2023-03-13 ENCOUNTER — Ambulatory Visit: Admission: RE | Admit: 2023-03-13 | Payer: No Typology Code available for payment source | Source: Ambulatory Visit

## 2023-03-13 DIAGNOSIS — Z510 Encounter for antineoplastic radiation therapy: Secondary | ICD-10-CM | POA: Diagnosis not present

## 2023-03-13 LAB — RAD ONC ARIA SESSION SUMMARY
Course Elapsed Days: 22
Plan Fractions Treated to Date: 17
Plan Prescribed Dose Per Fraction: 2.5 Gy
Plan Total Fractions Prescribed: 28
Plan Total Prescribed Dose: 70 Gy
Reference Point Dosage Given to Date: 42.5 Gy
Reference Point Session Dosage Given: 2.5 Gy
Session Number: 17

## 2023-03-13 NOTE — Progress Notes (Signed)
Mr. Viale came over to see nurse feeling fatigue.  Denies pain, dysuria, hematuria, and nausea/vomiting.  Reports nocturia x 3, frequency more so nightly than daytime, and weak urine stream.  Currently taking Tamsulosin.  Hydration fair drinks milk, water, and occasional milkshakes with Ensure.  Likes to eat all hours of the day and night.  Vitals:  97.4-66-129/80 sitting & 144/88 standing.  O2 sat 99%.  Glucose :153.  No observable signs of distress.  Advised to increase water intake & protein shakes and get rest.

## 2023-03-14 ENCOUNTER — Inpatient Hospital Stay: Payer: No Typology Code available for payment source

## 2023-03-14 ENCOUNTER — Other Ambulatory Visit: Payer: Self-pay

## 2023-03-14 ENCOUNTER — Ambulatory Visit
Admission: RE | Admit: 2023-03-14 | Discharge: 2023-03-14 | Disposition: A | Discharge status: Home / Self Care | Payer: No Typology Code available for payment source | Source: Ambulatory Visit | Attending: Radiation Oncology

## 2023-03-14 DIAGNOSIS — Z510 Encounter for antineoplastic radiation therapy: Secondary | ICD-10-CM | POA: Diagnosis not present

## 2023-03-14 LAB — RAD ONC ARIA SESSION SUMMARY
Course Elapsed Days: 23
Plan Fractions Treated to Date: 18
Plan Prescribed Dose Per Fraction: 2.5 Gy
Plan Total Fractions Prescribed: 28
Plan Total Prescribed Dose: 70 Gy
Reference Point Dosage Given to Date: 45 Gy
Reference Point Session Dosage Given: 2.5 Gy
Session Number: 18

## 2023-03-19 ENCOUNTER — Ambulatory Visit
Admission: RE | Admit: 2023-03-19 | Discharge: 2023-03-19 | Disposition: A | Discharge status: Home / Self Care | Payer: No Typology Code available for payment source | Source: Ambulatory Visit | Attending: Radiation Oncology | Admitting: Radiation Oncology

## 2023-03-19 ENCOUNTER — Other Ambulatory Visit: Payer: Self-pay

## 2023-03-19 ENCOUNTER — Inpatient Hospital Stay: Payer: No Typology Code available for payment source | Attending: Radiation Oncology

## 2023-03-19 ENCOUNTER — Ambulatory Visit: Payer: No Typology Code available for payment source

## 2023-03-19 DIAGNOSIS — Z510 Encounter for antineoplastic radiation therapy: Secondary | ICD-10-CM | POA: Insufficient documentation

## 2023-03-19 DIAGNOSIS — C61 Malignant neoplasm of prostate: Secondary | ICD-10-CM | POA: Diagnosis not present

## 2023-03-19 LAB — RAD ONC ARIA SESSION SUMMARY
Course Elapsed Days: 28
Plan Fractions Treated to Date: 19
Plan Prescribed Dose Per Fraction: 2.5 Gy
Plan Total Fractions Prescribed: 28
Plan Total Prescribed Dose: 70 Gy
Reference Point Dosage Given to Date: 47.5 Gy
Reference Point Session Dosage Given: 2.5 Gy
Session Number: 19

## 2023-03-20 ENCOUNTER — Ambulatory Visit
Admission: RE | Admit: 2023-03-20 | Discharge: 2023-03-20 | Disposition: A | Discharge status: Home / Self Care | Payer: No Typology Code available for payment source | Source: Ambulatory Visit | Attending: Radiation Oncology

## 2023-03-20 ENCOUNTER — Inpatient Hospital Stay: Payer: No Typology Code available for payment source

## 2023-03-20 ENCOUNTER — Other Ambulatory Visit: Payer: Self-pay

## 2023-03-20 DIAGNOSIS — Z510 Encounter for antineoplastic radiation therapy: Secondary | ICD-10-CM | POA: Diagnosis not present

## 2023-03-20 LAB — RAD ONC ARIA SESSION SUMMARY
Course Elapsed Days: 29
Plan Fractions Treated to Date: 20
Plan Prescribed Dose Per Fraction: 2.5 Gy
Plan Total Fractions Prescribed: 28
Plan Total Prescribed Dose: 70 Gy
Reference Point Dosage Given to Date: 50 Gy
Reference Point Session Dosage Given: 2.5 Gy
Session Number: 20

## 2023-03-21 ENCOUNTER — Ambulatory Visit
Admission: RE | Admit: 2023-03-21 | Discharge: 2023-03-21 | Disposition: A | Discharge status: Home / Self Care | Payer: No Typology Code available for payment source | Source: Ambulatory Visit | Attending: Radiation Oncology | Admitting: Radiation Oncology

## 2023-03-21 ENCOUNTER — Inpatient Hospital Stay: Payer: No Typology Code available for payment source

## 2023-03-21 ENCOUNTER — Other Ambulatory Visit: Payer: Self-pay

## 2023-03-21 DIAGNOSIS — Z510 Encounter for antineoplastic radiation therapy: Secondary | ICD-10-CM | POA: Diagnosis not present

## 2023-03-21 LAB — RAD ONC ARIA SESSION SUMMARY
Course Elapsed Days: 30
Plan Fractions Treated to Date: 21
Plan Prescribed Dose Per Fraction: 2.5 Gy
Plan Total Fractions Prescribed: 28
Plan Total Prescribed Dose: 70 Gy
Reference Point Dosage Given to Date: 52.5 Gy
Reference Point Session Dosage Given: 2.5 Gy
Session Number: 21

## 2023-03-22 ENCOUNTER — Inpatient Hospital Stay: Payer: No Typology Code available for payment source

## 2023-03-22 ENCOUNTER — Other Ambulatory Visit: Payer: Self-pay

## 2023-03-22 ENCOUNTER — Ambulatory Visit: Payer: No Typology Code available for payment source

## 2023-03-22 ENCOUNTER — Ambulatory Visit
Admission: RE | Admit: 2023-03-22 | Discharge: 2023-03-22 | Disposition: A | Discharge status: Home / Self Care | Payer: No Typology Code available for payment source | Source: Ambulatory Visit | Attending: Radiation Oncology | Admitting: Radiation Oncology

## 2023-03-22 DIAGNOSIS — Z510 Encounter for antineoplastic radiation therapy: Secondary | ICD-10-CM | POA: Diagnosis not present

## 2023-03-22 LAB — RAD ONC ARIA SESSION SUMMARY
Course Elapsed Days: 31
Plan Fractions Treated to Date: 22
Plan Prescribed Dose Per Fraction: 2.5 Gy
Plan Total Fractions Prescribed: 28
Plan Total Prescribed Dose: 70 Gy
Reference Point Dosage Given to Date: 55 Gy
Reference Point Session Dosage Given: 2.5 Gy
Session Number: 22

## 2023-03-23 ENCOUNTER — Ambulatory Visit
Admission: RE | Admit: 2023-03-23 | Discharge: 2023-03-23 | Disposition: A | Discharge status: Home / Self Care | Payer: No Typology Code available for payment source | Source: Ambulatory Visit | Attending: Radiation Oncology | Admitting: Radiation Oncology

## 2023-03-23 ENCOUNTER — Inpatient Hospital Stay: Payer: No Typology Code available for payment source

## 2023-03-23 ENCOUNTER — Ambulatory Visit: Payer: No Typology Code available for payment source

## 2023-03-23 ENCOUNTER — Other Ambulatory Visit: Payer: Self-pay

## 2023-03-23 DIAGNOSIS — Z510 Encounter for antineoplastic radiation therapy: Secondary | ICD-10-CM | POA: Diagnosis not present

## 2023-03-23 LAB — RAD ONC ARIA SESSION SUMMARY
Course Elapsed Days: 32
Plan Fractions Treated to Date: 23
Plan Prescribed Dose Per Fraction: 2.5 Gy
Plan Total Fractions Prescribed: 28
Plan Total Prescribed Dose: 70 Gy
Reference Point Dosage Given to Date: 57.5 Gy
Reference Point Session Dosage Given: 2.5 Gy
Session Number: 23

## 2023-03-26 ENCOUNTER — Ambulatory Visit
Admission: RE | Admit: 2023-03-26 | Discharge: 2023-03-26 | Disposition: A | Discharge status: Home / Self Care | Payer: No Typology Code available for payment source | Source: Ambulatory Visit | Attending: Radiation Oncology | Admitting: Radiation Oncology

## 2023-03-26 ENCOUNTER — Inpatient Hospital Stay: Payer: No Typology Code available for payment source

## 2023-03-26 ENCOUNTER — Other Ambulatory Visit: Payer: Self-pay

## 2023-03-26 DIAGNOSIS — Z510 Encounter for antineoplastic radiation therapy: Secondary | ICD-10-CM | POA: Diagnosis not present

## 2023-03-26 LAB — RAD ONC ARIA SESSION SUMMARY
Course Elapsed Days: 35
Plan Fractions Treated to Date: 24
Plan Prescribed Dose Per Fraction: 2.5 Gy
Plan Total Fractions Prescribed: 28
Plan Total Prescribed Dose: 70 Gy
Reference Point Dosage Given to Date: 60 Gy
Reference Point Session Dosage Given: 2.5 Gy
Session Number: 24

## 2023-03-26 NOTE — Progress Notes (Signed)
Willie Harris had radiation treatment went up to wait for ride when he was brought back down to clinic after expressing to staff in lobby feeling weak and dizzy/woosie.  Verbalized ate sandwich and had smoothie prior to coming in this afternoon for treatment.  His blood glucose reading this morning was 165 and this afternoon in clinic was 141 at 2:40 pm.  Patient does have a continuous monitor for blood glucose.  Willie Harris verbalized he did not taking morning medication which includes his BP medication.  And that he does have history of dizziness.  Denies fever, chills, muscle soreness, headache, sneezing or runny nose.  Reports coughing productive white color mucus.  Willie Harris did not want to see doctor at this time but stated will lets [place] know tomorrow how he's feeling and if at that time wants to see doctor.  Vitals were stable 98.2-61-17-155/65 o2 Sat 97% room air.  Reports had flu shot this year but he wasn't sure about the pneumonia shot.

## 2023-03-27 ENCOUNTER — Other Ambulatory Visit: Payer: Self-pay

## 2023-03-27 ENCOUNTER — Ambulatory Visit
Admission: RE | Admit: 2023-03-27 | Discharge: 2023-03-27 | Disposition: A | Discharge status: Home / Self Care | Payer: No Typology Code available for payment source | Source: Ambulatory Visit | Attending: Radiation Oncology

## 2023-03-27 ENCOUNTER — Inpatient Hospital Stay: Payer: No Typology Code available for payment source

## 2023-03-27 DIAGNOSIS — Z510 Encounter for antineoplastic radiation therapy: Secondary | ICD-10-CM | POA: Diagnosis not present

## 2023-03-27 LAB — RAD ONC ARIA SESSION SUMMARY
Course Elapsed Days: 36
Plan Fractions Treated to Date: 25
Plan Prescribed Dose Per Fraction: 2.5 Gy
Plan Total Fractions Prescribed: 28
Plan Total Prescribed Dose: 70 Gy
Reference Point Dosage Given to Date: 62.5 Gy
Reference Point Session Dosage Given: 2.5 Gy
Session Number: 25

## 2023-03-28 ENCOUNTER — Ambulatory Visit: Payer: No Typology Code available for payment source

## 2023-03-28 ENCOUNTER — Other Ambulatory Visit: Payer: Self-pay

## 2023-03-28 ENCOUNTER — Ambulatory Visit
Admission: RE | Admit: 2023-03-28 | Discharge: 2023-03-28 | Disposition: A | Discharge status: Home / Self Care | Payer: No Typology Code available for payment source | Source: Ambulatory Visit | Attending: Radiation Oncology | Admitting: Radiation Oncology

## 2023-03-28 ENCOUNTER — Inpatient Hospital Stay: Payer: No Typology Code available for payment source

## 2023-03-28 DIAGNOSIS — Z510 Encounter for antineoplastic radiation therapy: Secondary | ICD-10-CM | POA: Diagnosis not present

## 2023-03-28 LAB — RAD ONC ARIA SESSION SUMMARY
Course Elapsed Days: 37
Plan Fractions Treated to Date: 26
Plan Prescribed Dose Per Fraction: 2.5 Gy
Plan Total Fractions Prescribed: 28
Plan Total Prescribed Dose: 70 Gy
Reference Point Dosage Given to Date: 65 Gy
Reference Point Session Dosage Given: 2.5 Gy
Session Number: 26

## 2023-03-29 ENCOUNTER — Ambulatory Visit
Admission: RE | Admit: 2023-03-29 | Discharge: 2023-03-29 | Disposition: A | Discharge status: Home / Self Care | Payer: No Typology Code available for payment source | Source: Ambulatory Visit | Attending: Radiation Oncology | Admitting: Radiation Oncology

## 2023-03-29 ENCOUNTER — Other Ambulatory Visit: Payer: Self-pay

## 2023-03-29 ENCOUNTER — Inpatient Hospital Stay: Payer: No Typology Code available for payment source

## 2023-03-29 DIAGNOSIS — Z510 Encounter for antineoplastic radiation therapy: Secondary | ICD-10-CM | POA: Diagnosis not present

## 2023-03-29 LAB — RAD ONC ARIA SESSION SUMMARY
Course Elapsed Days: 38
Plan Fractions Treated to Date: 27
Plan Prescribed Dose Per Fraction: 2.5 Gy
Plan Total Fractions Prescribed: 28
Plan Total Prescribed Dose: 70 Gy
Reference Point Dosage Given to Date: 67.5 Gy
Reference Point Session Dosage Given: 2.5 Gy
Session Number: 27

## 2023-03-30 ENCOUNTER — Ambulatory Visit
Admission: RE | Admit: 2023-03-30 | Discharge: 2023-03-30 | Disposition: A | Discharge status: Home / Self Care | Payer: No Typology Code available for payment source | Source: Ambulatory Visit | Attending: Radiation Oncology

## 2023-03-30 ENCOUNTER — Other Ambulatory Visit: Payer: Self-pay

## 2023-03-30 ENCOUNTER — Inpatient Hospital Stay: Payer: No Typology Code available for payment source

## 2023-03-30 DIAGNOSIS — Z510 Encounter for antineoplastic radiation therapy: Secondary | ICD-10-CM | POA: Diagnosis not present

## 2023-03-30 LAB — RAD ONC ARIA SESSION SUMMARY
Course Elapsed Days: 39
Plan Fractions Treated to Date: 28
Plan Prescribed Dose Per Fraction: 2.5 Gy
Plan Total Fractions Prescribed: 28
Plan Total Prescribed Dose: 70 Gy
Reference Point Dosage Given to Date: 70 Gy
Reference Point Session Dosage Given: 2.5 Gy
Session Number: 28

## 2023-04-02 NOTE — Radiation Completion Notes (Signed)
Patient Name: Willie Harris, Willie Harris MRN: [id number] Date of Birth: 12-31-1935 Referring Physician: Ritta Slot, M.D. Date of Service: 2023-04-02 Radiation Oncologist: Margaretmary Bayley, M.D. [facility]                             RADIATION ONCOLOGY END OF TREATMENT NOTE     Diagnosis: C61 Malignant neoplasm of prostate Staging on 2022-12-05: Malignant neoplasm of prostate (HCC) T=cT2b, N=cN0, M=cM0 Intent: Curative     ==========DELIVERED PLANS==========  First Treatment Date: 2023-02-19 Last Treatment Date: 2023-03-30   Plan Name: Prostate Site: Prostate Technique: IMRT Mode: Photon Dose Per Fraction: 2.5 Gy Prescribed Dose (Delivered / Prescribed): 70 Gy / 70 Gy Prescribed Fxs (Delivered / Prescribed): 28 / 28     ==========ON TREATMENT VISIT DATES========== 2023-02-23, 2023-03-02, 2023-03-09, 2023-03-19, 2023-03-22, 2023-03-28     ==========UPCOMING VISITS==========       ==========APPENDIX - ON TREATMENT VISIT NOTES==========   See weekly On Treatment Notes in Epic for details in the Media tab (listed as Progress notes on the On Treatment Visit Dates listed above).

## 2023-04-10 NOTE — Progress Notes (Signed)
Patient was a RadOnc Consult on 01/30/23 for his stage T2c adenocarcinoma of the prostate with Gleason Score of 3+4, and PSA of 16.8.  Patient proceed with treatment recommendations of 5.5 weeks of daily radiation and had his final radiation treatment on 03/30/23.   Patient is scheduled for a post treatment nurse call on 05/01/23 and [facility]   RN spoke with patient and provided education on post treatment PSA monitoring.  All questions answered.

## 2023-05-01 ENCOUNTER — Ambulatory Visit
Admission: RE | Admit: 2023-05-01 | Discharge: 2023-05-01 | Disposition: A | Discharge status: Home / Self Care | Payer: No Typology Code available for payment source | Source: Ambulatory Visit | Attending: Radiation Oncology | Admitting: Radiation Oncology

## 2023-05-01 NOTE — Progress Notes (Signed)
  Radiation Oncology         [phone] ________________________________  Name: Willie Harris MRN: [id number]  Date of Service: 05/01/2023  DOB: 1936-03-22  Post Treatment Telephone Note  Diagnosis:  C61 Malignant neoplasm of prostate (as documented in provider EOT note)  Pre Treatment IPSS Score: 20 (as documented in the provider consult note)  The patient was available for call today.   Symptoms of fatigue have improved since completing therapy.  Symptoms of bladder changes have improved since completing therapy. Current symptoms include none, and medications for bladder symptoms include Tamsulosin.  Symptoms of bowel changes have improved since completing therapy. Current symptoms include none, and medications for bowel symptoms include none.   Post Treatment IPSS Score: IPSS Questionnaire (AUA-7): Over the past month.   1)  How often have you had a sensation of not emptying your bladder completely after you finish urinating?  0 - Not at all  2)  How often have you had to urinate again less than two hours after you finished urinating? 0 - Not at all  3)  How often have you found you stopped and started again several times when you urinated?  0 - Not at all  4) How difficult have you found it to postpone urination?  0 - Not at all  5) How often have you had a weak urinary stream?  0 - Not at all  6) How often have you had to push or strain to begin urination?  0 - Not at all  7) How many times did you most typically get up to urinate from the time you went to bed until the time you got up in the morning?  3 - 3 times  Total score:  3. Which indicates mild symptoms  0-7 mildly symptomatic   8-19 moderately symptomatic   20-35 severely symptomatic   Patient has a scheduled follow up visit with Dr. Toribio Libra for ongoing surveillance. He was counseled that PSA levels will be drawn in the urology office, and was reassured that additional time is expected to improve bowel  and bladder symptoms. He was encouraged to call back with concerns or questions regarding radiation.   This concludes the interaction.  Rosaline Minerva, LPN

## 2023-05-16 ENCOUNTER — Emergency Department (HOSPITAL_COMMUNITY): Payer: No Typology Code available for payment source

## 2023-05-16 ENCOUNTER — Inpatient Hospital Stay (HOSPITAL_COMMUNITY)
Admission: EM | Admit: 2023-05-16 | Discharge: 2023-05-20 | DRG: 309 | Disposition: A | Discharge status: Home / Self Care | Payer: No Typology Code available for payment source | Attending: Family Medicine | Admitting: Family Medicine

## 2023-05-16 ENCOUNTER — Encounter (HOSPITAL_COMMUNITY): Payer: Self-pay | Admitting: Internal Medicine

## 2023-05-16 ENCOUNTER — Other Ambulatory Visit: Payer: Self-pay

## 2023-05-16 DIAGNOSIS — Z7951 Long term (current) use of inhaled steroids: Secondary | ICD-10-CM

## 2023-05-16 DIAGNOSIS — I480 Paroxysmal atrial fibrillation: Secondary | ICD-10-CM | POA: Diagnosis not present

## 2023-05-16 DIAGNOSIS — E785 Hyperlipidemia, unspecified: Secondary | ICD-10-CM | POA: Diagnosis present

## 2023-05-16 DIAGNOSIS — I4891 Unspecified atrial fibrillation: Principal | ICD-10-CM | POA: Diagnosis present

## 2023-05-16 DIAGNOSIS — K219 Gastro-esophageal reflux disease without esophagitis: Secondary | ICD-10-CM | POA: Diagnosis present

## 2023-05-16 DIAGNOSIS — Z882 Allergy status to sulfonamides status: Secondary | ICD-10-CM

## 2023-05-16 DIAGNOSIS — Z1152 Encounter for screening for COVID-19: Secondary | ICD-10-CM

## 2023-05-16 DIAGNOSIS — Z96651 Presence of right artificial knee joint: Secondary | ICD-10-CM | POA: Diagnosis present

## 2023-05-16 DIAGNOSIS — Z87891 Personal history of nicotine dependence: Secondary | ICD-10-CM

## 2023-05-16 DIAGNOSIS — R001 Bradycardia, unspecified: Secondary | ICD-10-CM | POA: Diagnosis not present

## 2023-05-16 DIAGNOSIS — Z881 Allergy status to other antibiotic agents status: Secondary | ICD-10-CM

## 2023-05-16 DIAGNOSIS — Z923 Personal history of irradiation: Secondary | ICD-10-CM

## 2023-05-16 DIAGNOSIS — G4733 Obstructive sleep apnea (adult) (pediatric): Secondary | ICD-10-CM | POA: Diagnosis present

## 2023-05-16 DIAGNOSIS — Z833 Family history of diabetes mellitus: Secondary | ICD-10-CM

## 2023-05-16 DIAGNOSIS — D6869 Other thrombophilia: Secondary | ICD-10-CM | POA: Diagnosis present

## 2023-05-16 DIAGNOSIS — I4892 Unspecified atrial flutter: Secondary | ICD-10-CM | POA: Diagnosis present

## 2023-05-16 DIAGNOSIS — Z5948 Other specified lack of adequate food: Secondary | ICD-10-CM

## 2023-05-16 DIAGNOSIS — Z7984 Long term (current) use of oral hypoglycemic drugs: Secondary | ICD-10-CM

## 2023-05-16 DIAGNOSIS — Z7901 Long term (current) use of anticoagulants: Secondary | ICD-10-CM

## 2023-05-16 DIAGNOSIS — I10 Essential (primary) hypertension: Secondary | ICD-10-CM | POA: Diagnosis present

## 2023-05-16 DIAGNOSIS — Z79899 Other long term (current) drug therapy: Secondary | ICD-10-CM

## 2023-05-16 DIAGNOSIS — J449 Chronic obstructive pulmonary disease, unspecified: Secondary | ICD-10-CM | POA: Diagnosis present

## 2023-05-16 DIAGNOSIS — Z8673 Personal history of transient ischemic attack (TIA), and cerebral infarction without residual deficits: Secondary | ICD-10-CM

## 2023-05-16 DIAGNOSIS — C61 Malignant neoplasm of prostate: Secondary | ICD-10-CM | POA: Diagnosis present

## 2023-05-16 DIAGNOSIS — E1140 Type 2 diabetes mellitus with diabetic neuropathy, unspecified: Secondary | ICD-10-CM | POA: Diagnosis present

## 2023-05-16 DIAGNOSIS — Z5941 Food insecurity: Secondary | ICD-10-CM

## 2023-05-16 DIAGNOSIS — Z8546 Personal history of malignant neoplasm of prostate: Secondary | ICD-10-CM

## 2023-05-16 DIAGNOSIS — F419 Anxiety disorder, unspecified: Secondary | ICD-10-CM | POA: Diagnosis present

## 2023-05-16 LAB — MAGNESIUM: Magnesium: 2.1 mg/dL (ref 1.7–2.4)

## 2023-05-16 LAB — CBC
HCT: 44.9 % (ref 39.0–52.0)
Hemoglobin: 14.4 g/dL (ref 13.0–17.0)
MCH: 29.4 pg (ref 26.0–34.0)
MCHC: 32.1 g/dL (ref 30.0–36.0)
MCV: 91.8 fL (ref 80.0–100.0)
Platelets: 173 10*3/uL (ref 150–400)
RBC: 4.89 MIL/uL (ref 4.22–5.81)
RDW: 13.7 % (ref 11.5–15.5)
WBC: 6.7 10*3/uL (ref 4.0–10.5)
nRBC: 0 % (ref 0.0–0.2)

## 2023-05-16 LAB — BASIC METABOLIC PANEL
Anion gap: 10 (ref 5–15)
BUN: 19 mg/dL (ref 8–23)
CO2: 24 mmol/L (ref 22–32)
Calcium: 9.3 mg/dL (ref 8.9–10.3)
Chloride: 106 mmol/L (ref 98–111)
Creatinine, Ser: 1.16 mg/dL (ref 0.61–1.24)
GFR, Estimated: >60 mL/min (ref >60)
Glucose, Bld: 111 mg/dL — ABNORMAL HIGH (ref 70–99)
Potassium: 3.5 mmol/L (ref 3.5–5.1)
Sodium: 140 mmol/L (ref 135–145)

## 2023-05-16 LAB — CBG MONITORING, ED
Glucose-Capillary: 172 mg/dL — ABNORMAL HIGH (ref 70–99)
Glucose-Capillary: 89 mg/dL (ref 70–99)

## 2023-05-16 LAB — RESP PANEL BY RT-PCR (RSV, FLU A&B, COVID)  RVPGX2
Influenza A by PCR: NEGATIVE
Influenza B by PCR: NEGATIVE
Resp Syncytial Virus by PCR: NEGATIVE
SARS Coronavirus 2 by RT PCR: NEGATIVE

## 2023-05-16 LAB — BRAIN NATRIURETIC PEPTIDE: B Natriuretic Peptide: 86.8 pg/mL (ref 0.0–100.0)

## 2023-05-16 MED ORDER — ALBUTEROL SULFATE (2.5 MG/3ML) 0.083% IN NEBU: 2.5000 mg | INHALATION_SOLUTION | RESPIRATORY_TRACT | Status: DC | PRN

## 2023-05-16 MED ORDER — ACETAMINOPHEN 650 MG RE SUPP
650.0000 mg | Freq: Four times a day (QID) | RECTAL | Status: DC | PRN
Start: 2023-05-16 — End: 2023-05-20

## 2023-05-16 MED ORDER — ONDANSETRON HCL 4 MG PO TABS
4.0000 mg | ORAL_TABLET | Freq: Four times a day (QID) | ORAL | Status: DC | PRN
  Administered 2023-05-20: 4 mg via ORAL
  Filled 2023-05-16: qty 1

## 2023-05-16 MED ORDER — POTASSIUM CHLORIDE CRYS ER 20 MEQ PO TBCR
40.0000 meq | EXTENDED_RELEASE_TABLET | Freq: Once | ORAL | Status: AC
  Administered 2023-05-16: 40 meq via ORAL
  Filled 2023-05-16: qty 2

## 2023-05-16 MED ORDER — TRAZODONE HCL 50 MG PO TABS
25.0000 mg | ORAL_TABLET | Freq: Every evening | ORAL | Status: DC | PRN
Start: 2023-05-16 — End: 2023-05-20

## 2023-05-16 MED ORDER — INSULIN ASPART 100 UNIT/ML IJ SOLN
0.0000 [IU] | Freq: Three times a day (TID) | INTRAMUSCULAR | Status: DC
  Administered 2023-05-17: 2 [IU] via SUBCUTANEOUS
  Filled 2023-05-16: qty 0.15

## 2023-05-16 MED ORDER — DILTIAZEM HCL-DEXTROSE 125-5 MG/125ML-% IV SOLN (PREMIX)
5.0000 mg/h | INTRAVENOUS | Status: DC
  Administered 2023-05-16: 5 mg/h via INTRAVENOUS
  Filled 2023-05-16: qty 125

## 2023-05-16 MED ORDER — METOPROLOL TARTRATE 5 MG/5ML IV SOLN
5.0000 mg | Freq: Once | INTRAVENOUS | Status: AC
Start: 2023-05-16 — End: 2023-05-16
  Administered 2023-05-16: 5 mg via INTRAVENOUS
  Filled 2023-05-16: qty 5

## 2023-05-16 MED ORDER — ONDANSETRON HCL 4 MG/2ML IJ SOLN: 4.0000 mg | Freq: Four times a day (QID) | INTRAMUSCULAR | Status: DC | PRN

## 2023-05-16 MED ORDER — INSULIN ASPART 100 UNIT/ML IJ SOLN
0.0000 [IU] | Freq: Every day | INTRAMUSCULAR | Status: DC
  Administered 2023-05-19: 2 [IU] via SUBCUTANEOUS
  Filled 2023-05-16: qty 0.05

## 2023-05-16 MED ORDER — ACETAMINOPHEN 325 MG PO TABS
650.0000 mg | ORAL_TABLET | Freq: Four times a day (QID) | ORAL | Status: DC | PRN
  Administered 2023-05-16 – 2023-05-20 (×2): 650 mg via ORAL
  Filled 2023-05-16 (×2): qty 2

## 2023-05-16 MED ORDER — DILTIAZEM HCL 30 MG PO TABS
60.0000 mg | ORAL_TABLET | Freq: Once | ORAL | Status: AC
  Administered 2023-05-16: 60 mg via ORAL
  Filled 2023-05-16: qty 2

## 2023-05-16 MED ORDER — APIXABAN 5 MG PO TABS
5.0000 mg | ORAL_TABLET | Freq: Two times a day (BID) | ORAL | Status: DC
Start: 2023-05-16 — End: 2023-05-20
  Administered 2023-05-16 – 2023-05-20 (×8): 5 mg via ORAL
  Filled 2023-05-16 (×8): qty 1

## 2023-05-16 NOTE — Progress Notes (Signed)
     Patient Name: Willie Harris           DOB: 03-Sep-1935  MRN: [id number]      Admission Date: 05/16/2023  Attending Provider: Maryln Gottron, MD  Primary Diagnosis: Atrial fibrillation with rapid ventricular response (HCC)   Level of care: Telemetry   OVERNIGHT PROGRESS REPORT   Notified by bedside RN of patient's heart rate dropping to 40-50s while on Cardizem gtt. for A-fib.  Asymptomatic to bradycardia. Cardizem gtt. has been discontinued.  Patient remains in A-fib.  Nursing staff to continue monitoring HR    Anthoney Harada, DNP, [employer] [facility] [place]

## 2023-05-16 NOTE — H&P (Signed)
History and Physical  Willie Harris WUJ:[id number] DOB: 28-Oct-1935 DOA: 05/16/2023  PCP: [facility]   Chief Complaint: Dizziness  HPI: Willie Harris is a 88 y.o. male with medical history significant for COPD on room air, not insulin-dependent type 2 diabetes, prostate cancer getting radiation, atrial flutter on Eliquis but not on rate control due to a history of bradycardia being admitted to the hospital with A-fib with RVR.  Apparently he went to the [place] today for a routine outpatient checkup, was noted to be in A-fib with RVR.  Heart rate was reportedly in the 230s.  [facility], he is on Eliquis, has a prior history of bradycardia from 2011 and is not on any rate control.  Patient and his niece who is at the bedside tell me that he has been a little bit lightheaded and feeling weak for the last 1 to 2 weeks, little bit worse when standing up, and with ambulation.  He denies any cough, fevers, chills, chest pain, nausea, vomiting, or diarrhea.  In the emergency department, he was given a dose of 5 mg IV metoprolol, and a dose of oral diltiazem.  Heart rate initially improved, but he was later started on IV Cardizem drip.  Hospitalist was contacted for admission.  Review of Systems: Please see HPI for pertinent positives and negatives. A complete 10 system review of systems are otherwise negative.  Past Medical History:  Diagnosis Date   Anxiety    Atrial flutter (HCC)    per [facility] report   Cataract    bilateral   COPD (chronic obstructive pulmonary disease) (HCC)    Diabetes mellitus type 2, noninsulin dependent (HCC)    Elevated PSA    GERD (gastroesophageal reflux disease)    HLD (hyperlipidemia)    HTN (hypertension)    OA (osteoarthritis)    OSA (obstructive sleep apnea)    has CPAP, not currently using it   TIA (transient ischemic attack)    Past Surgical History:  Procedure Laterality Date   KNEE ARTHROPLASTY Right    PROSTATE BIOPSY     Social  History:  reports that he quit smoking about 10 years ago. His smoking use included cigarettes. He has never used smokeless tobacco. He reports that he does not currently use alcohol. He reports current drug use. Drug: Marijuana.  Allergies  Allergen Reactions   Doxycycline Itching   Sulfa Antibiotics Other (See Comments)    Causing skin on hand to get real tight    Family History  Problem Relation Age of Onset   Diabetes Mother    Diabetes Father    Diabetes Sister      Prior to Admission medications   Medication Sig Start Date End Date Taking? Authorizing Provider  albuterol (PROVENTIL HFA;VENTOLIN HFA) 108 (90 Base) MCG/ACT inhaler Inhale 1-2 puffs into the lungs every 6 (six) hours as needed for wheezing or shortness of breath. 03/23/18   Alene Mires, NP  amLODipine (NORVASC) 5 MG tablet Take 5 mg by mouth daily.    [provider]  budesonide-formoterol (SYMBICORT) 160-4.5 MCG/ACT inhaler Inhale 2 puffs into the lungs 2 (two) times daily.    [provider]  clonazePAM (KLONOPIN) 1 MG tablet Take 0.5 tablets (0.5 mg total) by mouth 2 (two) times daily. 02/04/14   Harris, Abigail, PA-C  Continuous Glucose Sensor (FREESTYLE LIBRE 2 SENSOR) MISC AS DIRECTED THREE TIMES DAILY AND AS NEEDED FOR GLUCOSE TESTING 12/10/22   [provider]  cycloSPORINE (  RESTASIS) 0.05 % ophthalmic emulsion Apply to eye. 06/14/22   [provider]  diclofenac Sodium (VOLTAREN) 1 % GEL Apply topically. 02/02/22   [provider]  gabapentin (NEURONTIN) 100 MG capsule Take by mouth. 02/02/22   [provider]  meclizine (ANTIVERT) 25 MG tablet Take 0.5 tablets (12.5 mg total) by mouth 3 (three) times daily as needed for dizziness. 12/04/20   Milagros Loll, MD  melatonin 3 MG TABS tablet Take 3 mg by mouth at bedtime. Take 3 tablets (9 mg) by mouth at bedtime for Insomnia. 04/07/22   [provider]  Multiple Vitamins-Minerals  (PRESERVISION AREDS 2+MULTI VIT) CAPS Take 1 capsule by mouth 2 (two) times daily. 06/14/22   [provider]  pantoprazole (PROTONIX) 40 MG tablet Take by mouth. 04/07/22   [provider]  pravastatin (PRAVACHOL) 40 MG tablet Take 40 mg by mouth daily.    [provider]  PSYLLIUM FIBER PO Take by mouth. 05/26/22   [provider]  SITagliptin 50 MG TABS Take by mouth. 10/23/22   [provider]  tadalafil (CIALIS) 20 MG tablet Take 20 mg by mouth. 03/29/22   [provider]  tamsulosin (FLOMAX) 0.4 MG CAPS capsule Take by mouth. 06/02/22   [provider]    Physical Exam: BP 117/83   Pulse (!) 52   Temp 97.9 F (36.6 C) (Oral)   Resp (!) 21   SpO2 98%  General:  Alert, oriented, calm, in no acute distress, resting comfortably. Eyes: EOMI, clear conjuctivae, white sclerea Neck: supple, no masses, trachea mildline  Cardiovascular: Tachycardic and irregular, heart rate about 100, no murmurs or rubs, no peripheral edema  Respiratory: clear to auscultation bilaterally, no wheezes, no crackles  Abdomen: soft, nontender, nondistended, normal bowel tones heard  Skin: dry, no rashes  Musculoskeletal: no joint effusions, normal range of motion  Psychiatric: appropriate affect, normal speech  Neurologic: extraocular muscles intact, clear speech, moving all extremities with intact sensorium         Labs on Admission:  Basic Metabolic Panel: Recent Labs  Lab 05/16/23 1230  NA 140  K 3.5  CL 106  CO2 24  GLUCOSE 111*  BUN 19  CREATININE 1.16  CALCIUM 9.3  MG 2.1   Liver Function Tests: No results for input(s): "AST", "ALT", "ALKPHOS", "BILITOT", "PROT", "ALBUMIN" in the last 168 hours. No results for input(s): "LIPASE", "AMYLASE" in the last 168 hours. No results for input(s): "AMMONIA" in the last 168 hours. CBC: Recent Labs  Lab 05/16/23 1230  WBC 6.7  HGB 14.4  HCT 44.9  MCV 91.8  PLT 173   Cardiac Enzymes: No  results for input(s): "CKTOTAL", "CKMB", "CKMBINDEX", "TROPONINI" in the last 168 hours. BNP (last 3 results) Recent Labs    05/16/23 1230  BNP 86.8    ProBNP (last 3 results) No results for input(s): "PROBNP" in the last 8760 hours.  CBG: No results for input(s): "GLUCAP" in the last 168 hours.  Radiological Exams on Admission: DG Chest 2 View Result Date: 05/16/2023 CLINICAL DATA:  Shortness of breath. EXAM: CHEST - 2 VIEW COMPARISON:  Chest radiograph dated May 13, 2022. FINDINGS: The heart size and mediastinal contours are within normal limits. Aortic atherosclerosis. No focal consolidation, pleural effusion, or pneumothorax. Multilevel degenerative changes of the spine. No acute osseous abnormality. IMPRESSION: No acute cardiopulmonary findings. Electronically Signed   By: Hart Robinsons M.D.   On: 05/16/2023 14:06   Assessment/Plan Willie Harris is  a 88 y.o. male with medical history significant for COPD on room air, not insulin-dependent type 2 diabetes, prostate cancer getting radiation, atrial flutter on Eliquis but not on rate control due to a history of bradycardia being admitted to the hospital with A-fib with RVR.   A-fib with RVR-he has known history of atrial fibrillation/flutter, has a previous history of bradycardia presumably this is why he is not on any rate control medication.  Otherwise no inciting factor for his RVR is identified. -Observation admission -Continue IV Cardizem drip, anticipate transition to oral rate control in the morning -Eliquis anticoagulation -Note magnesium is greater than 2, will give oral potassium to ensure potassium greater than 4  Type 2 diabetes-non-insulin-dependent -Check hemoglobin A1c -Carb modified diet -Moderate dose sliding scale insulin  History of prostate cancer-managed at the [facility]  COPD-currently on room air, no evidence of exacerbation  Hypertension and hyperlipidemia-resume home medications once reconciled  DVT  prophylaxis: Eliquis    Code Status: Full Code  Consults called: None  Admission status: Observation  Time spent: 49 minutes  Drishti Pepperman Sharlette Dense MD [facility] Pager [phone]  If 7PM-7AM, please contact night-coverage [email or web address] Password [facility]  05/16/2023, 4:15 PM

## 2023-05-16 NOTE — ED Provider Notes (Signed)
[place] EMERGENCY DEPARTMENT AT [facility] Provider Note   CSN: [id number] Arrival date & time: 05/16/23  1133     History  Chief Complaint  Patient presents with   Dizziness    Willie Harris is a 88 y.o. male w/ hx of A Fib or A Flutter on eliquis, presenting to ED with tachycardia.  Pt went ot [place] today for checkup, noted to be in A Fib with RVR, HR reportedly "230's" [facility] at [facility].  Sent to ED for eval.  Pt has a hx of A Fib documented in PMHx from [place] papers and he's on eliquis. NOT on rate control medications (prior hx of bradycardia from 2011 noted).    Pt feels "a light lightheaded and SOB" for 1-2 weeks (confirmed by his niece at bedside). Says this is worse with standing and walking.  States he almost passed out at [place] today. At rest, he  denies palpitations, chest pressure, LOC, leg swelling, worsening orthopnea.    HPI     Home Medications Prior to Admission medications   Medication Sig Start Date End Date Taking? Authorizing Provider  albuterol (PROVENTIL HFA;VENTOLIN HFA) 108 (90 Base) MCG/ACT inhaler Inhale 1-2 puffs into the lungs every 6 (six) hours as needed for wheezing or shortness of breath. 03/23/18   Alene Mires, NP  amLODipine (NORVASC) 5 MG tablet Take 5 mg by mouth daily.    [provider]  budesonide-formoterol (SYMBICORT) 160-4.5 MCG/ACT inhaler Inhale 2 puffs into the lungs 2 (two) times daily.    [provider]  clonazePAM (KLONOPIN) 1 MG tablet Take 0.5 tablets (0.5 mg total) by mouth 2 (two) times daily. 02/04/14   Harris, Cammy Copa, PA-C  Continuous Glucose Sensor (FREESTYLE LIBRE 2 SENSOR) MISC AS DIRECTED THREE TIMES DAILY AND AS NEEDED FOR GLUCOSE TESTING 12/10/22   [provider]  cycloSPORINE (RESTASIS) 0.05 % ophthalmic emulsion Apply to eye. 06/14/22   [provider]  diclofenac Sodium (VOLTAREN) 1 % GEL Apply topically. 02/02/22   [provider]  gabapentin  (NEURONTIN) 100 MG capsule Take by mouth. 02/02/22   [provider]  meclizine (ANTIVERT) 25 MG tablet Take 0.5 tablets (12.5 mg total) by mouth 3 (three) times daily as needed for dizziness. 12/04/20   Milagros Loll, MD  melatonin 3 MG TABS tablet Take 3 mg by mouth at bedtime. Take 3 tablets (9 mg) by mouth at bedtime for Insomnia. 04/07/22   [provider]  Multiple Vitamins-Minerals (PRESERVISION AREDS 2+MULTI VIT) CAPS Take 1 capsule by mouth 2 (two) times daily. 06/14/22   [provider]  pantoprazole (PROTONIX) 40 MG tablet Take by mouth. 04/07/22   [provider]  pravastatin (PRAVACHOL) 40 MG tablet Take 40 mg by mouth daily.    [provider]  PSYLLIUM FIBER PO Take by mouth. 05/26/22   [provider]  SITagliptin 50 MG TABS Take by mouth. 10/23/22   [provider]  tadalafil (CIALIS) 20 MG tablet Take 20 mg by mouth. 03/29/22   [provider]  tamsulosin (FLOMAX) 0.4 MG CAPS capsule Take by mouth. 06/02/22   [provider]      Allergies    Doxycycline and Sulfa antibiotics    Review of Systems   Review of Systems  Physical Exam Updated Vital Signs BP 122/86   Pulse (!) 108   Temp 97.9 F (36.6 C) (Oral)   Resp 20   SpO2 99%  Physical Exam  Constitutional:      General: He is not in acute distress. HENT:     Head: Normocephalic and atraumatic.  Eyes:     Conjunctiva/sclera: Conjunctivae normal.     Pupils: Pupils are equal, round, and reactive to light.  Cardiovascular:     Rate and Rhythm: Tachycardia present. Rhythm irregular.  Pulmonary:     Effort: Pulmonary effort is normal. No respiratory distress.  Abdominal:     General: There is no distension.     Tenderness: There is no abdominal tenderness.  Skin:    General: Skin is warm and dry.  Neurological:     General: No focal deficit present.     Mental Status: He is alert. Mental status is at baseline.  Psychiatric:         Mood and Affect: Mood normal.        Behavior: Behavior normal.     ED Results / Procedures / Treatments   Labs (all labs ordered are listed, but only abnormal results are displayed) Labs Reviewed  BASIC METABOLIC PANEL - Abnormal; Notable for the following components:      Result Value   Glucose, Bld 111 (*)    All other components within normal limits  RESP PANEL BY RT-PCR (RSV, FLU A&B, COVID)  RVPGX2  CBC  MAGNESIUM  BRAIN NATRIURETIC PEPTIDE    EKG EKG Interpretation Date/Time:  Wednesday May 16 2023 13:14:50 EST Ventricular Rate:  110 PR Interval:    QRS Duration:  85 QT Interval:  335 QTC Calculation: 454 R Axis:   21  Text Interpretation: Atrial fibrillation Confirmed by Alvester Chou [phone]) on 05/16/2023 2:03:26 PM  Radiology DG Chest 2 View Result Date: 05/16/2023 CLINICAL DATA:  Shortness of breath. EXAM: CHEST - 2 VIEW COMPARISON:  Chest radiograph dated May 13, 2022. FINDINGS: The heart size and mediastinal contours are within normal limits. Aortic atherosclerosis. No focal consolidation, pleural effusion, or pneumothorax. Multilevel degenerative changes of the spine. No acute osseous abnormality. IMPRESSION: No acute cardiopulmonary findings. Electronically Signed   By: Hart Robinsons M.D.   On: 05/16/2023 14:06    Procedures .Critical Care  Performed by: Terald Sleeper, MD Authorized by: Terald Sleeper, MD   Critical care provider statement:    Critical care time (minutes):  40   Critical care time was exclusive of:  Separately billable procedures and treating other patients   Critical care was necessary to treat or prevent imminent or life-threatening deterioration of the following conditions:  Circulatory failure   Critical care was time spent personally by me on the following activities:  Ordering and performing treatments and interventions, ordering and review of laboratory studies, ordering and review of radiographic studies, pulse  oximetry, review of old charts, examination of patient and evaluation of patient's response to treatment Comments:     Rate control for a fib rvr     Medications Ordered in ED Medications  diltiazem (CARDIZEM) 125 mg in dextrose 5% 125 mL (1 mg/mL) infusion (5 mg/hr Intravenous New Bag/Given 05/16/23 1553)  metoprolol tartrate (LOPRESSOR) injection 5 mg (5 mg Intravenous Given 05/16/23 1217)  diltiazem (CARDIZEM) tablet 60 mg (60 mg Oral Given 05/16/23 1432)    ED Course/ Medical Decision Making/ A&P Clinical Course as of 05/16/23 1554  Wed May 16, 2023  1402 Patient remains quite comfortable in the room.  No significant change in his heart rate with metoprolol, remains 110 to 130 bpm and regular.  We will try oral diltiazem now,  60 mg, to see if this might improve his heart rate to a reasonable degree for home discharge with outpatient cards follow up, which is his wish [MT]    Clinical Course User Index [MT] Korbin Mapps, Kermit Balo, MD                                 Medical Decision Making Amount and/or Complexity of Data Reviewed Labs: ordered. Radiology: ordered.  Risk Prescription drug management. Decision regarding hospitalization.   This patient presents to the ED with concern for tachycardia, asymptomatic. This involves an extensive number of treatment options, and is a complaint that carries with it a high risk of complications and morbidity.  The differential diagnosis includes A-fib with RVR versus electrolyte derangement versus infection versus other history of A-fib  Co-morbidities that complicate the patient evaluation: History of atrial fibrillation  External records from outside source obtained and reviewed including printed records from [facility] today  I ordered and personally interpreted labs.  The pertinent results include: No emergent findings.  Specifically no significant electrolyte derangement.  No acute anemia.  BMP, COVID and viral panel within normal  limits  I ordered imaging studies including x-ray of the chest I independently visualized and interpreted imaging which showed no focal infiltrate I agree with the radiologist interpretation  The patient was maintained on a cardiac monitor.  I personally viewed and interpreted the cardiac monitored which showed an underlying rhythm of: A-fib with RVR  Per my interpretation the patient's ECG shows A-fib with RVR, no acute ischemia  I ordered medication including IV beta-blocker, oral calcium channel blocker - no improvement after these medications - HR still 110-130 bpm.  Pt started on IV diltiazem infusion for medical admission.  I have reviewed the patients home medicines and have made adjustments as needed  Test Considered: Low suspicion for acute PE  After the interventions noted above, I reevaluated the patient and found that they have: stayed the same - no improvement of HR.     Dispostion:  Patient will be admitted on IV rate control medication - signed out to Dr Posey Rea EDP pending admission team callback.         Final Clinical Impression(s) / ED Diagnoses Final diagnoses:  Atrial fibrillation with RVR [facility])    Rx / DC Orders ED Discharge Orders     None         Terald Sleeper, MD 05/16/23 1556

## 2023-05-16 NOTE — ED Notes (Signed)
Pt care taken, resting, has cardizem running with a heartrate of 64. No complaints at this time.

## 2023-05-16 NOTE — ED Triage Notes (Signed)
Per EMS from [place] clinic. Pt went for an appt. Reported HR 140 and dizziness. Hx aflutter.  BP 128/82 HR 130 CBG 168 RR O2 98 RA

## 2023-05-17 DIAGNOSIS — E119 Type 2 diabetes mellitus without complications: Secondary | ICD-10-CM

## 2023-05-17 DIAGNOSIS — I4891 Unspecified atrial fibrillation: Secondary | ICD-10-CM

## 2023-05-17 DIAGNOSIS — I495 Sick sinus syndrome: Secondary | ICD-10-CM | POA: Diagnosis not present

## 2023-05-17 LAB — BASIC METABOLIC PANEL
Anion gap: 6 (ref 5–15)
BUN: 23 mg/dL (ref 8–23)
CO2: 26 mmol/L (ref 22–32)
Calcium: 9.3 mg/dL (ref 8.9–10.3)
Chloride: 106 mmol/L (ref 98–111)
Creatinine, Ser: 1.29 mg/dL — ABNORMAL HIGH (ref 0.61–1.24)
GFR, Estimated: 54 mL/min — ABNORMAL LOW (ref >60)
Glucose, Bld: 129 mg/dL — ABNORMAL HIGH (ref 70–99)
Potassium: 4.5 mmol/L (ref 3.5–5.1)
Sodium: 138 mmol/L (ref 135–145)

## 2023-05-17 LAB — CBC
HCT: 41.3 % (ref 39.0–52.0)
Hemoglobin: 13.4 g/dL (ref 13.0–17.0)
MCH: 29.7 pg (ref 26.0–34.0)
MCHC: 32.4 g/dL (ref 30.0–36.0)
MCV: 91.6 fL (ref 80.0–100.0)
Platelets: 151 10*3/uL (ref 150–400)
RBC: 4.51 MIL/uL (ref 4.22–5.81)
RDW: 13.9 % (ref 11.5–15.5)
WBC: 6.1 10*3/uL (ref 4.0–10.5)
nRBC: 0 % (ref 0.0–0.2)

## 2023-05-17 LAB — GLUCOSE, CAPILLARY
Glucose-Capillary: 58 mg/dL — ABNORMAL LOW (ref 70–99)
Glucose-Capillary: 72 mg/dL (ref 70–99)
Glucose-Capillary: 103 mg/dL — ABNORMAL HIGH (ref 70–99)
Glucose-Capillary: 125 mg/dL — ABNORMAL HIGH (ref 70–99)

## 2023-05-17 LAB — CBG MONITORING, ED
Glucose-Capillary: 103 mg/dL — ABNORMAL HIGH (ref 70–99)
Glucose-Capillary: 124 mg/dL — ABNORMAL HIGH (ref 70–99)

## 2023-05-17 LAB — HEMOGLOBIN A1C
Hgb A1c MFr Bld: 7.1 % — ABNORMAL HIGH (ref 4.8–5.6)
Mean Plasma Glucose: 157.07 mg/dL

## 2023-05-17 MED ORDER — MELATONIN 3 MG PO TABS
3.0000 mg | ORAL_TABLET | Freq: Every day | ORAL | Status: DC
  Administered 2023-05-17 – 2023-05-19 (×3): 3 mg via ORAL
  Filled 2023-05-17 (×3): qty 1

## 2023-05-17 MED ORDER — AMLODIPINE BESYLATE 10 MG PO TABS
5.0000 mg | ORAL_TABLET | Freq: Every day | ORAL | Status: DC
  Administered 2023-05-17 – 2023-05-18 (×2): 5 mg via ORAL
  Filled 2023-05-17 (×2): qty 1

## 2023-05-17 MED ORDER — DILTIAZEM HCL 30 MG PO TABS: 60.0000 mg | ORAL_TABLET | Freq: Three times a day (TID) | ORAL | Status: DC

## 2023-05-17 MED ORDER — CLONAZEPAM 0.5 MG PO TABS
0.5000 mg | ORAL_TABLET | Freq: Two times a day (BID) | ORAL | Status: DC | PRN
  Administered 2023-05-17 – 2023-05-20 (×4): 0.5 mg via ORAL
  Filled 2023-05-17 (×4): qty 1

## 2023-05-17 MED ORDER — DILTIAZEM HCL 30 MG PO TABS: 30.0000 mg | ORAL_TABLET | Freq: Two times a day (BID) | ORAL | Status: DC

## 2023-05-17 MED ORDER — PANTOPRAZOLE SODIUM 40 MG PO TBEC
40.0000 mg | DELAYED_RELEASE_TABLET | Freq: Every day | ORAL | Status: DC
  Administered 2023-05-17 – 2023-05-20 (×4): 40 mg via ORAL
  Filled 2023-05-17 (×4): qty 1

## 2023-05-17 MED ORDER — INSULIN ASPART 100 UNIT/ML IJ SOLN
0.0000 [IU] | Freq: Three times a day (TID) | INTRAMUSCULAR | Status: DC
Start: 2023-05-17 — End: 2023-05-20
  Administered 2023-05-18: 2 [IU] via SUBCUTANEOUS
  Administered 2023-05-18 – 2023-05-20 (×2): 1 [IU] via SUBCUTANEOUS

## 2023-05-17 MED ORDER — MOMETASONE FURO-FORMOTEROL FUM 200-5 MCG/ACT IN AERO
2.0000 | INHALATION_SPRAY | Freq: Two times a day (BID) | RESPIRATORY_TRACT | Status: DC
  Administered 2023-05-17 – 2023-05-20 (×6): 2 via RESPIRATORY_TRACT
  Filled 2023-05-17: qty 8.8

## 2023-05-17 MED ORDER — MECLIZINE HCL 25 MG PO TABS: 12.5000 mg | ORAL_TABLET | Freq: Three times a day (TID) | ORAL | Status: DC | PRN

## 2023-05-17 NOTE — Inpatient Diabetes Management (Signed)
Inpatient Diabetes Program Recommendations  AACE/ADA: New Consensus Statement on Inpatient Glycemic Control (2015)  Target Ranges:  Prepandial:   less than 140 mg/dL      Peak postprandial:   less than 180 mg/dL (1-2 hours)      Critically ill patients:  140 - 180 mg/dL   Lab Results  Component Value Date   GLUCAP 72 05/17/2023   HGBA1C 7.1 (H) 05/17/2023    Review of Glycemic Control  Latest Reference Range & Units 05/17/23 13:04 05/17/23 13:33  Glucose-Capillary 70 - 99 mg/dL 58 (L) 72  (L): Data is abnormally low Diabetes history: Type 2 DM Outpatient Diabetes medications: Amaryl 2 mg every day, Jardiance 25 mg every day, Metformin 500/1000, Actos 45 mg QD Current orders for Inpatient glycemic control: Novolog 0-9 units TID  Inpatient Diabetes Program Recommendations:    Noted hypoglycemia of 58 mg/dL following Novolog 2 units; consider reducing correction to Novolog 0-6 units TID.   Thanks, Lujean Rave, MSN, RNC-OB Diabetes Coordinator [phone] (8a-5p)

## 2023-05-17 NOTE — Progress Notes (Signed)
Pt arrived to rm 1427 from ED. Blood sugar 58. Juice and peanut butter crackers given. Blood sugar 72 when rechecked. Meal ordered. Vitals stable at this time. SB on tele monitor.

## 2023-05-17 NOTE — Consult Note (Addendum)
Cardiology Consultation   Patient ID: Willie Harris MRN: [id number]; DOB: 01/24/1936  Admit date: 05/16/2023 Date of Consult: 05/17/2023  PCP:  Clinic, [facility] Providers Cardiologist:  None        Patient Profile:   Willie Harris is a 88 y.o. male with a hx of COPD, hypertension, DM type II, prostate cancer, atrial fibrillation/flutter, TIA who is being seen 05/17/2023 for the evaluation of afib with RVR at the request of Dr. Benjamine Mola.  History of Present Illness:   Willie Harris presented to the ED on 1/29 via EMS from [place] clinic. Patient had been noted to be in afib with RVR, ventricular rate 136 per ECG from [place]. Prior notes from ED and H&P report HR of 230. [facility] record states that this was patient's glucose level, not HR. Over the past few weeks, patient with lightheadedness and shortness of breath, worse with ambulation. He had acute onset of these symptoms while at the [place]. He was initially treated with IV Diltiazem but had spontaneous conversion to sinus rhythm with bradycardia and diltiazem stopped.   Per limited notes available from the [place], patient appears to be followed by both Dr. Jodie Echevaria (general cardiology) and Dr. Diannia Ruder (EP). He was recently seen on 05/01/23 by Dr. Jodie Echevaria but I am unable to review this office visit note. He was also seen by Dr. Diannia Ruder in October. This note indicates that patient reported chronic dizziness felt to be more consistent with imbalance and vestibular etiology. A Zio monitor from earlier in 2024 had shown paroxysmal atrial fibrillation and flutter with 6% burden. There was discussion of PRN beta blocker for rate control but patient was reluctant to add medication and plan made for watchful waiting with pending repeat Zio study in 2025.   It also appears that previously, patient's dizziness and weakness has been associated with hypoglycemia. [facility] consulted on patient in January 2024 after he presented with very similar  constellation of symptoms. Just like this admission, he was initially in afib with RVR and subsequently converted to NSR with bradycardia.   On exam, patient with mild confusion but generally able to provide HPI. He reports acute on chronic dizziness yesterday morning and while at [facility] before being found in afib with RVR.  Past Medical History:  Diagnosis Date   Anxiety    Atrial flutter (HCC)    per [facility] report   Cataract    bilateral   COPD (chronic obstructive pulmonary disease) (HCC)    Diabetes mellitus type 2, noninsulin dependent (HCC)    Elevated PSA    GERD (gastroesophageal reflux disease)    HLD (hyperlipidemia)    HTN (hypertension)    OA (osteoarthritis)    OSA (obstructive sleep apnea)    has CPAP, not currently using it   TIA (transient ischemic attack)     Past Surgical History:  Procedure Laterality Date   KNEE ARTHROPLASTY Right    PROSTATE BIOPSY       Home Medications:  Prior to Admission medications   Medication Sig Start Date End Date Taking? Authorizing Provider  albuterol (PROVENTIL HFA;VENTOLIN HFA) 108 (90 Base) MCG/ACT inhaler Inhale 1-2 puffs into the lungs every 6 (six) hours as needed for wheezing or shortness of breath. 03/23/18   Alene Mires, NP  amLODipine (NORVASC) 5 MG tablet Take 5 mg by mouth daily.    [provider]  budesonide-formoterol (SYMBICORT) 160-4.5 MCG/ACT inhaler Inhale 2 puffs into the lungs 2 (two)  times daily.    [provider]  clonazePAM (KLONOPIN) 1 MG tablet Take 0.5 tablets (0.5 mg total) by mouth 2 (two) times daily. Patient taking differently: Take 0.5 mg by mouth in the morning and at bedtime. 02/04/14   Harris, Cammy Copa, PA-C  Continuous Glucose Sensor (FREESTYLE LIBRE 2 SENSOR) MISC Inject 1 Device into the skin every 14 (fourteen) days. 12/10/22   [provider]  cycloSPORINE (RESTASIS) 0.05 % ophthalmic emulsion Apply to eye. 06/14/22   [provider]  diclofenac Sodium  (VOLTAREN) 1 % GEL Apply topically. 02/02/22   [provider]  gabapentin (NEURONTIN) 100 MG capsule Take by mouth. 02/02/22   [provider]  meclizine (ANTIVERT) 25 MG tablet Take 0.5 tablets (12.5 mg total) by mouth 3 (three) times daily as needed for dizziness. 12/04/20   Milagros Loll, MD  melatonin 3 MG TABS tablet Take 3 mg by mouth at bedtime. Take 3 tablets (9 mg) by mouth at bedtime for Insomnia. 04/07/22   [provider]  Multiple Vitamins-Minerals (PRESERVISION AREDS 2+MULTI VIT) CAPS Take 1 capsule by mouth 2 (two) times daily. 06/14/22   [provider]  pantoprazole (PROTONIX) 40 MG tablet Take by mouth. 04/07/22   [provider]  pravastatin (PRAVACHOL) 40 MG tablet Take 40 mg by mouth daily.    [provider]  PSYLLIUM FIBER PO Take by mouth. 05/26/22   [provider]  SITagliptin 50 MG TABS Take by mouth. 10/23/22   [provider]  tadalafil (CIALIS) 20 MG tablet Take 20 mg by mouth. 03/29/22   [provider]  tamsulosin (FLOMAX) 0.4 MG CAPS capsule Take by mouth. 06/02/22   [provider]    Inpatient Medications: Scheduled Meds:  amLODipine  5 mg Oral Daily   apixaban  5 mg Oral BID   insulin aspart  0-15 Units Subcutaneous TID WC   insulin aspart  0-5 Units Subcutaneous QHS   melatonin  3 mg Oral QHS   mometasone-formoterol  2 puff Inhalation BID   pantoprazole  40 mg Oral Daily   Continuous Infusions:  PRN Meds: acetaminophen **OR** acetaminophen, albuterol, clonazePAM, meclizine, ondansetron **OR** ondansetron (ZOFRAN) IV, traZODone  Allergies:    Allergies  Allergen Reactions   Doxycycline Itching   Sulfa Antibiotics Other (See Comments)    Caused skin on hand to get "really tight"    Social History:   Social History   Socioeconomic History   Marital status: Divorced    Spouse name: Not on file   Number of children: Not on file   Years of education: Not  on file   Highest education level: Not on file  Occupational History   Not on file  Tobacco Use   Smoking status: Former    Current packs/day: 0.00    Types: Cigarettes    Quit date: 05/09/2013    Years since quitting: 10.0   Smokeless tobacco: Never  Vaping Use   Vaping status: Never Used  Substance and Sexual Activity   Alcohol use: Not Currently   Drug use: Yes    Types: Marijuana    Comment: occas   Sexual activity: Not Currently  Other Topics Concern   Not on file  Social History Narrative   Not on file   Social Drivers of Health   Financial Resource Strain: Not on file  Food Insecurity: Food Insecurity Present (01/30/2023)   Hunger Vital Sign    Worried About Running Out of Food in the  Last Year: Sometimes true    Ran Out of Food in the Last Year: Sometimes true  Transportation Needs: No Transportation Needs (01/30/2023)   PRAPARE - Administrator, Civil Service (Medical): No    Lack of Transportation (Non-Medical): No  Physical Activity: Not on file  Stress: Not on file  Social Connections: Unknown (09/01/2022)   Received from [facility]   Social Network    Social Network: Not on file  Intimate Partner Violence: Not At Risk (01/30/2023)   Humiliation, Afraid, Rape, and Kick questionnaire    Fear of Current or Ex-Partner: No    Emotionally Abused: No    Physically Abused: No    Sexually Abused: No    Family History:    Family History  Problem Relation Age of Onset   Diabetes Mother    Diabetes Father    Diabetes Sister      ROS:  Please see the history of present illness.   All other ROS reviewed and negative.     Physical Exam/Data:   Vitals:   05/17/23 0510 05/17/23 0600 05/17/23 0946 05/17/23 0951  BP: (!) 153/71 (!) 141/67 129/88 129/88  Pulse: (!) 48 (!) 47 69   Resp: 15 16 18    Temp:  98 F (36.7 C) 97.6 F (36.4 C)   TempSrc:   Oral   SpO2: 99% 96% 98%   Weight:      Height:       No intake or output  data in the 24 hours ending 05/17/23 1008    05/16/2023    9:57 PM 01/30/2023    8:31 AM 10/05/2022   10:59 AM  Last 3 Weights  Weight (lbs) 166 lb 166 lb 8 oz 169 lb 1.5 oz  Weight (kg) 75.297 kg 75.524 kg 76.7 kg     Body mass index is 23.15 kg/m.  General:  Well nourished, well developed, in no acute distress HEENT: normal Neck: no JVD Vascular: No carotid bruits; Distal pulses 2+ bilaterally Cardiac:  normal S1, S2; RRR; no murmur  Lungs:  clear to auscultation bilaterally, no wheezing, rhonchi or rales  Abd: soft, nontender, no hepatomegaly  Ext: no edema Musculoskeletal:  No deformities, BUE and BLE strength normal and equal Skin: warm and dry  Neuro:  CNs 2-12 intact, no focal abnormalities noted Psych:  Normal affect   EKG:  The EKG was personally reviewed and demonstrates:  sinus bradycardia with HR 47 Telemetry:  Telemetry was personally reviewed and demonstrates:  initial telemetry with afib, RVR into the 130s. Post spontaneous conversion to NSR, patient with HR 38-60s. Isolated PACs noted.   Relevant CV Studies:  Zio 05/2022 Predominant underlying rhythm was sinus rhythm at a rate varying from 33 bpm  to 126 bpm. Rare PACs including 5 short SVTs; the longest SVT lasted for 8 beats with an  average rate of 97 bpm. There were rare PVCs including 1 short 5 beat nonsustained VT with a maximum  rate of 197 bpm. There was paroxysmal atrial fibrillation/atrial flutter with an A.fib burden  of 6% last noted. No significant pauses or AV block detected. Rare PACs and PVCs some of which correlated with patient triggered events.   Laboratory Data:  High Sensitivity Troponin:  No results for input(s): "TROPONINIHS" in the last 720 hours.   Chemistry Recent Labs  Lab 05/16/23 1230 05/17/23 0509  NA 140 138  K 3.5 4.5  CL 106 106  CO2 24 26  GLUCOSE  111* 129*  BUN 19 23  CREATININE 1.16 1.29*  CALCIUM 9.3 9.3  MG 2.1  --   GFRNONAA >60 54*  ANIONGAP 10 6    No  results for input(s): "PROT", "ALBUMIN", "AST", "ALT", "ALKPHOS", "BILITOT" in the last 168 hours. Lipids No results for input(s): "CHOL", "TRIG", "HDL", "LABVLDL", "LDLCALC", "CHOLHDL" in the last 168 hours.  Hematology Recent Labs  Lab 05/16/23 1230 05/17/23 0509  WBC 6.7 6.1  RBC 4.89 4.51  HGB 14.4 13.4  HCT 44.9 41.3  MCV 91.8 91.6  MCH 29.4 29.7  MCHC 32.1 32.4  RDW 13.7 13.9  PLT 173 151   Thyroid No results for input(s): "TSH", "FREET4" in the last 168 hours.  BNP Recent Labs  Lab 05/16/23 1230  BNP 86.8    DDimer No results for input(s): "DDIMER" in the last 168 hours.   Radiology/Studies:  DG Chest 2 View Result Date: 05/16/2023 CLINICAL DATA:  Shortness of breath. EXAM: CHEST - 2 VIEW COMPARISON:  Chest radiograph dated May 13, 2022. FINDINGS: The heart size and mediastinal contours are within normal limits. Aortic atherosclerosis. No focal consolidation, pleural effusion, or pneumothorax. Multilevel degenerative changes of the spine. No acute osseous abnormality. IMPRESSION: No acute cardiopulmonary findings. Electronically Signed   By: Hart Robinsons M.D.   On: 05/16/2023 14:06     Assessment and Plan:   Paroxysmal atrial fibrillation and flutter Dizziness/lightheadedness Secondary hypercoagulable state  Patient admitted after being sent from [facility] clinic with afib/RVR. Per records sent with patient from [place], he apparently had HR up to "230s." Zio in 2024 showed predominant sinus rhythm with HR 33-126. Rare PACs noted. Paroxysmal afib/flutter with burden 6%. PACs and PVCs correlated with patient triggered events. No afib associated symptoms triggers.   VA ECG with afib, ventricular rate 136. ECG today s/p spontaneous conversion to NSR shows sinus bradycardia, HR 47 with biphasic P wave.  Certainly his acute dizziness yesterday is consistent with RVR but suspect multi-factorial etiology of patient's chronic dizziness. As has previously been noted, he appears to  have episodic hypoglycemia. Additionally he reports neuropathy leading to sense of imbalance and BMP may reflect a degree of dehydration.  When stimulated and talking, patient appears to sustain HR in the 60s. However, telemetry does show frequent bradycardia as well with HR as low as 38. This is persistent even well after patient converted to NSR, does not appear to be specifically a post-conversion rhythm. This bradycardia will limit AV nodal agent use. Previous [facility] consult note from 2024 noted possibility of using amiodarone. From a rhythm/rate standpoint, this is a valid option. However, Amiodarone can be associated with peripheral neuropathy, ataxia, and poor coordination and I worry about exacerbating his pre-existing diabetic neuropathy. Continue Eliquis 5mg  BID.  Ultimately I think patient would benefit from outpatient PPM placement to treat afib with tachy-brady. This would allow use of beta blocker to manage RVR. With stability of rates and no acute symptoms with bradycardia, recommend close outpatient follow up with Dr. Diannia Ruder at [facility]. No medication changes recommended at this time.   DM type II Prostate cancer COPD AKI  Per primary team  Risk Assessment/Risk Scores:          CHA2DS2-VASc Score = 6   This indicates a 9.7% annual risk of stroke. The patient's score is based upon: CHF History: 0 HTN History: 1 Diabetes History: 1 Stroke History: 2 Vascular Disease History: 0 Age Score: 2 Gender Score: 0  For questions or updates, please contact [facility] Please consult [email or web address] for contact info under    Signed, Perlie Gold, PA-C  05/17/2023 10:08 AM   I have seen and examined the patient along with Perlie Gold, PA-C .  I have reviewed the chart, notes and new data.  I agree with PA/NP's note.  Key new complaints: He just moved up to the inpatient unit.  He feels a little woozy.  His CGM shows a glucose level of 57.  He was administered  insulin but has been not really had anything to eat since breakfast.  After orange juice, he seems a little more alert.  He is definitely able to hold a coherent conversation. Key examination changes: His rhythm is slow and regular but with very frequent superimposed ectopy.  Rates in the 50s, PACs on the monitor.  Otherwise normal cardiovascular examination. Key new findings / data: Reviewed the ECG on arrival showing atrial fibrillation with rapid ventricular response in the 135 bpm range, as well as the ECG after spontaneous conversion to sinus bradycardia with a heart rate of only 47 bpm.  No other significant abnormalities on these tracings other than the arrhythmia.  Currently on telemetry the rhythm is sinus bradycardia around 50 bpm with superimposed PACs.  PLAN: He has evidence of both sinus node dysfunction and paroxysmal atrial fibrillation with rapid ventricular response.  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  The episodes of clinically symptomatic atrial fibrillation  appear to be happening with slowly increasing his frequency as he gets older. I think the right solution for this gentleman is a dual-chamber permanent pacemaker, which will then allow [place] to treat with maintenance AV nodal blocking agents or antiarrhythmics without being worried about severe bradycardia.   Having said that, pacemaker implantation is not urgently necessary and should be done as an outpatient.  He has a well-established relationship with a cardiac electrophysiologist already.  I am sure he would prefer to have the pacemaker implanted within the [place], for multiple reasons. Will reach out to Dr. Diannia Ruder to make sure that Willie Harris has an upcoming appointment. Without pacemaker backup, I do not think we can safely treat Willie Harris with rate control or antiarrhythmic medications.  Thurmon Fair, MD, [facility] [phone] 05/17/2023,  1:16 PM

## 2023-05-17 NOTE — Progress Notes (Signed)
Progress Note    KALEL HARTY  ZOX:[id number] DOB: 26-Jun-1935  DOA: 05/16/2023 PCP: [facility]    Brief Narrative:     Medical records reviewed and are as summarized below:  NOSSON WENDER is an 88 y.o. male with medical history significant for COPD on room air, not insulin-dependent type 2 diabetes, prostate cancer getting radiation, atrial flutter on Eliquis but not on rate control due to a history of bradycardia being admitted to the hospital with A-fib with RVR.  Apparently he went to the [place] today for a routine outpatient checkup, was noted to be in A-fib with RVR.  Heart rate was reportedly in the 230s.  [facility], he is on Eliquis, has a prior history of bradycardia from 2011 and is not on any rate control.  Patient and his niece who is at the bedside tell me that he has been a little bit lightheaded and feeling weak for the last 1 to 2 weeks, little bit worse when standing up, and with ambulation.   Assessment/Plan:   Principal Problem:   Atrial fibrillation with rapid ventricular response (HCC)   A-fib with RVR-he has known history of atrial fibrillation/flutter, has a previous history of bradycardia presumably this is why he is not on any rate control medication.  Otherwise no inciting factor for his RVR is identified. - IV Cardizem drip stopped due to bradycardia -Eliquis anticoagulation -Mg and K at goal -last admission 1/24:  A-fib with RVR with baseline PAF - now sinus bradycardia - hold off on any AV nodal blockers - continue eliquis - appreciate cardiology recommendations - they note he should follow up with his primary cardiologist to consider antiarrhythmic like amiodarone  -will ask cardiology to weigh in again  Type 2 diabetes-non-insulin-dependent -Carb modified diet -Moderate dose sliding scale insulin A1c: 7.1   History of prostate cancer -managed at the [facility]   COPD -currently on room air, no evidence of exacerbation    AKI -trend BMP  Hypertension and hyperlipidemia    PT eval   Family Communication/Anticipated D/C date and plan/Code Status   DVT prophylaxis: Lovenox ordered. Code Status: Full Code.   Disposition Plan: Status is: Observation The patient will require care spanning > 2 midnights and should be moved to inpatient     Medical Consultants:   cards  Subjective:   Does not remember when the last time he saw his cardiologist at the [facility], pacemaker was dicussed years ago  Objective:    Vitals:   05/17/23 0230 05/17/23 0300 05/17/23 0510 05/17/23 0600  BP:  138/64 (!) 153/71 (!) 141/67  Pulse: (!) 49 (!) 48 (!) 48 (!) 47  Resp: 10 11 15 16   Temp:  98.1 F (36.7 C)  98 F (36.7 C)  TempSrc:      SpO2: 99% 97% 99% 96%  Weight:      Height:       No intake or output data in the 24 hours ending 05/17/23 0758 Filed Weights   05/16/23 2157  Weight: 75.3 kg    Exam:  General: Appearance:    Well developed, well nourished male in no acute distress     Lungs:     respirations unlabored  Heart:    Bradycardic. irregular  MS:   All extremities are intact.   Neurologic:   Awake, alert     Data Reviewed:   I have personally reviewed following labs and imaging studies:  Labs: Labs show the  following:   Basic Metabolic Panel: Recent Labs  Lab 05/16/23 1230 05/17/23 0509  NA 140 138  K 3.5 4.5  CL 106 106  CO2 24 26  GLUCOSE 111* 129*  BUN 19 23  CREATININE 1.16 1.29*  CALCIUM 9.3 9.3  MG 2.1  --    GFR Estimated Creatinine Clearance: 44.6 mL/min (A) (by C-G formula based on SCr of 1.29 mg/dL (H)). Liver Function Tests: No results for input(s): "AST", "ALT", "ALKPHOS", "BILITOT", "PROT", "ALBUMIN" in the last 168 hours. No results for input(s): "LIPASE", "AMYLASE" in the last 168 hours. No results for input(s): "AMMONIA" in the last 168 hours. Coagulation profile No results for input(s): "INR", "PROTIME" in the last 168 hours.  CBC: Recent Labs  Lab  05/16/23 1230 05/17/23 0509  WBC 6.7 6.1  HGB 14.4 13.4  HCT 44.9 41.3  MCV 91.8 91.6  PLT 173 151   Cardiac Enzymes: No results for input(s): "CKTOTAL", "CKMB", "CKMBINDEX", "TROPONINI" in the last 168 hours. BNP (last 3 results) No results for input(s): "PROBNP" in the last 8760 hours. CBG: Recent Labs  Lab 05/16/23 1724 05/16/23 2134  GLUCAP 172* 89   D-Dimer: No results for input(s): "DDIMER" in the last 72 hours. Hgb A1c: Recent Labs    05/17/23 0509  HGBA1C 7.1*   Lipid Profile: No results for input(s): "CHOL", "HDL", "LDLCALC", "TRIG", "CHOLHDL", "LDLDIRECT" in the last 72 hours. Thyroid function studies: No results for input(s): "TSH", "T4TOTAL", "T3FREE", "THYROIDAB" in the last 72 hours.  Invalid input(s): "FREET3" Anemia work up: No results for input(s): "VITAMINB12", "FOLATE", "FERRITIN", "TIBC", "IRON", "RETICCTPCT" in the last 72 hours. Sepsis Labs: Recent Labs  Lab 05/16/23 1230 05/17/23 0509  WBC 6.7 6.1    Microbiology Recent Results (from the past 240 hours)  Resp panel by RT-PCR (RSV, Flu A&B, Covid) Anterior Nasal Swab     Status: None   Collection Time: 05/16/23 12:30 PM   Specimen: Anterior Nasal Swab  Result Value Ref Range Status   SARS Coronavirus 2 by RT PCR NEGATIVE NEGATIVE Final    Comment: (NOTE) SARS-CoV-2 target nucleic acids are NOT DETECTED.  The SARS-CoV-2 RNA is generally detectable in upper respiratory specimens during the acute phase of infection. The lowest concentration of SARS-CoV-2 viral copies this assay can detect is 138 copies/mL. A negative result does not preclude SARS-Cov-2 infection and should not be used as the sole basis for treatment or other patient management decisions. A negative result may occur with  improper specimen collection/handling, submission of specimen other than nasopharyngeal swab, presence of viral mutation(s) within the areas targeted by this assay, and inadequate number of  viral copies(<138 copies/mL). A negative result must be combined with clinical observations, patient history, and epidemiological information. The expected result is Negative.  Fact Sheet for Patients:  BloggerCourse.com  Fact Sheet for Healthcare Providers:  SeriousBroker.it  This test is no t yet approved or cleared by the [place] FDA and  has been authorized for detection and/or diagnosis of SARS-CoV-2 by FDA under an Emergency Use Authorization (EUA). This EUA will remain  in effect (meaning this test can be used) for the duration of the COVID-19 declaration under Section 564(b)(1) of the Act, 21 U.S.C.section 360bbb-3(b)(1), unless the authorization is terminated  or revoked sooner.       Influenza A by PCR NEGATIVE NEGATIVE Final   Influenza B by PCR NEGATIVE NEGATIVE Final    Comment: (NOTE) The Xpert Xpress SARS-CoV-2/FLU/RSV plus assay is intended as an aid  in the diagnosis of influenza from Nasopharyngeal swab specimens and should not be used as a sole basis for treatment. Nasal washings and aspirates are unacceptable for Xpert Xpress SARS-CoV-2/FLU/RSV testing.  Fact Sheet for Patients: BloggerCourse.com  Fact Sheet for Healthcare Providers: SeriousBroker.it  This test is not yet approved or cleared by the [place] FDA and has been authorized for detection and/or diagnosis of SARS-CoV-2 by FDA under an Emergency Use Authorization (EUA). This EUA will remain in effect (meaning this test can be used) for the duration of the COVID-19 declaration under Section 564(b)(1) of the Act, 21 U.S.C. section 360bbb-3(b)(1), unless the authorization is terminated or revoked.     Resp Syncytial Virus by PCR NEGATIVE NEGATIVE Final    Comment: (NOTE) Fact Sheet for Patients: BloggerCourse.com  Fact Sheet for Healthcare  Providers: SeriousBroker.it  This test is not yet approved or cleared by the [place] FDA and has been authorized for detection and/or diagnosis of SARS-CoV-2 by FDA under an Emergency Use Authorization (EUA). This EUA will remain in effect (meaning this test can be used) for the duration of the COVID-19 declaration under Section 564(b)(1) of the Act, 21 U.S.C. section 360bbb-3(b)(1), unless the authorization is terminated or revoked.  Performed at [facility], [address] [place], [place] [address]     Procedures and diagnostic studies:  DG Chest 2 View Result Date: 05/16/2023 CLINICAL DATA:  Shortness of breath. EXAM: CHEST - 2 VIEW COMPARISON:  Chest radiograph dated May 13, 2022. FINDINGS: The heart size and mediastinal contours are within normal limits. Aortic atherosclerosis. No focal consolidation, pleural effusion, or pneumothorax. Multilevel degenerative changes of the spine. No acute osseous abnormality. IMPRESSION: No acute cardiopulmonary findings. Electronically Signed   By: Hart Robinsons M.D.   On: 05/16/2023 14:06    Medications:    apixaban  5 mg Oral BID   insulin aspart  0-15 Units Subcutaneous TID WC   insulin aspart  0-5 Units Subcutaneous QHS   Continuous Infusions:   LOS: 0 days   Joseph Art  [facility]   How to contact the [facility] Attending or Consulting provider 7A - 7P or covering provider during after hours 7P -7A, for this patient?  Check the care team in [facility] and look for a) attending/consulting TR[facility] provider listed and b) the [facility] team listed Log into [email or web address] and use [place]'s universal password to access. If you do not have the password, please contact the hospital operator. Locate the [facility] provider you are looking for under [facility] and page to a number that you can be directly reached. If you still have difficulty reaching the provider, please page the [facility]  (Director on Call) for the Hospitalists listed on amion for assistance.  05/17/2023, 7:58 AM

## 2023-05-18 DIAGNOSIS — I4891 Unspecified atrial fibrillation: Secondary | ICD-10-CM | POA: Diagnosis not present

## 2023-05-18 LAB — BASIC METABOLIC PANEL WITH GFR
Anion gap: 7 (ref 5–15)
BUN: 22 mg/dL (ref 8–23)
CO2: 25 mmol/L (ref 22–32)
Calcium: 9.7 mg/dL (ref 8.9–10.3)
Chloride: 103 mmol/L (ref 98–111)
Creatinine, Ser: 1.1 mg/dL (ref 0.61–1.24)
GFR, Estimated: >60 mL/min
Glucose, Bld: 121 mg/dL — ABNORMAL HIGH (ref 70–99)
Potassium: 3.6 mmol/L (ref 3.5–5.1)
Sodium: 135 mmol/L (ref 135–145)

## 2023-05-18 LAB — GLUCOSE, CAPILLARY
Glucose-Capillary: 118 mg/dL — ABNORMAL HIGH (ref 70–99)
Glucose-Capillary: 148 mg/dL — ABNORMAL HIGH (ref 70–99)
Glucose-Capillary: 179 mg/dL — ABNORMAL HIGH (ref 70–99)
Glucose-Capillary: 144 mg/dL — ABNORMAL HIGH (ref 70–99)

## 2023-05-18 MED ORDER — DILTIAZEM HCL-DEXTROSE 125-5 MG/125ML-% IV SOLN (PREMIX)
5.0000 mg/h | INTRAVENOUS | Status: DC
  Administered 2023-05-18: 5 mg/h via INTRAVENOUS
  Administered 2023-05-18 (×2): 10 mg/h via INTRAVENOUS
  Filled 2023-05-18: qty 125

## 2023-05-18 NOTE — Progress Notes (Addendum)
Patient Name: Willie Harris Date of Encounter: 05/18/2023 Primary Cardiologist: [facility]: Dr. Jodie Echevaria (gen cards), Dr. Diannia Ruder (EP)  Interval Summary  .    Patient awake and ordering breakfast on my exam this morning. He reports no overnight symptoms, no palpitations, dizziness/lightheadedness but admits that the latter most often occurs with ambulation and positional changes.   Vital Signs .    Vitals:   05/17/23 1756 05/17/23 2014 05/17/23 2147 05/18/23 0215  BP: (!) 159/84  (!) 149/73 135/79  Pulse: (!) 56  (!) 50 66  Resp: 20  14 14   Temp: 98.5 F (36.9 C)  98.1 F (36.7 C) 98.3 F (36.8 C)  TempSrc: Oral  Oral Oral  SpO2: 100% 97% 100% 100%  Weight:      Height:        Intake/Output Summary (Last 24 hours) at 05/18/2023 0752 Last data filed at 05/18/2023 0437 Gross per 24 hour  Intake 254.21 ml  Output 200 ml  Net 54.21 ml      05/16/2023    9:57 PM 01/30/2023    8:31 AM 10/05/2022   10:59 AM  Last 3 Weights  Weight (lbs) 166 lb 166 lb 8 oz 169 lb 1.5 oz  Weight (kg) 75.297 kg 75.524 kg 76.7 kg      Telemetry/ECG    Sinus rhythm. Baseline artifact this AM. Intermittent bradycardia with HR into the upper 30s. Frequent PACs noted. No pauses or evidence of heart block - Personally Reviewed  Physical Exam .   GEN: No acute distress.   Neck: No JVD Cardiac: Irregular (PACs), no murmurs, rubs, or gallops.  Respiratory: Clear to auscultation bilaterally. GI: Soft, nontender, non-distended  MS: No edema  Assessment & Plan .     Willie Harris is a 88 y.o. male with a hx of COPD, hypertension, DM type II, prostate cancer, atrial fibrillation/flutter, TIA who is being seen 05/17/2023 for the evaluation of afib with RVR at the request of Dr. Benjamine Mola.   Paroxysmal atrial fibrillation and flutter Dizziness/lightheadedness Secondary hypercoagulable state  Patient admitted after being sent to ED from [facility] clinic with symptomatic afib/RVR. Of note, had very similar clinical  presentation 1 year ago. Subsequently wore a Zio that showed sinus rhythm with HR 33-126 with PACs and 6% afib burden. PACs and PVCs correlated with patient triggered events. No afib associated symptoms triggers.   Patient now back in sinus rhythm with intermittent PACs, bradycardia. Appears to have a degree of sinus node dysfunction with superimposed paroxysmal afib. Given that this is now his second presentation to the ED with afib/RVR, he would benefit from rate/rhythm controlling agent. However, with tachy-brady phenomenon, would not be able to safely take a rate or rhythm controlling agent. Recommend [facility] Dr. Diannia Ruder for evaluation of dual chamber pacemaker.  Continue Eliquis 5mg  BID. No medication changes recommended at this time. Avoid AV nodal agents.  DM type II Prostate cancer COPD AKI  Per primary team    For questions or updates, please contact [facility] Please consult [email or web address] for contact info under        Signed, Perlie Gold, PA-C   I have seen and examined the patient along with Perlie Gold, PA-C .  I have reviewed the chart, notes and new data.  I agree with PA/NP's note.  Key new complaints: Mild orthostatic dizziness, otherwise no cardiovascular complaints. Key examination changes: Regular rate and rhythm, mildly bradycardic, frequent additional ectopic beats  Key new findings / data: Remains in sinus rhythm with frequent PACs on monitor.  No severe bradycardia seen.  PLAN:  [facility] will sign off.   Medication Recommendations: +5 mg twice daily. Other recommendations (labs, testing, etc): Reasonable for him to wear another 14-day rhythm monitor, but best to be performed via his primary cardiology team. Follow up as an outpatient:  with him recommend follow-up with his cardiology team at [facility] (Dr. Jodie Echevaria and Dr. Diannia Ruder).    Thurmon Fair, MD, [facility] [phone] 05/18/2023, 11:00  AM

## 2023-05-18 NOTE — Progress Notes (Signed)
  Progress Note   Patient: Willie Harris:[id number] DOB: 12/25/1935 DOA: 05/16/2023     0 DOS: the patient was seen and examined on 05/18/2023   Brief hospital course: 11M history of atrial fibrillation on Eliquis but not on rate control, admitted with A-fib RVR.  IV Cardizem drip.  Seen by cardiology, challenging case given tachybradycardia type syndrome.  Cardiology initially recommended discharge home with close outpatient follow-up and consideration of pacemaker placement by his [facility] electrophysi[facility], however the patient developed A-fib RVR again 1/31.  I discussed the case with cardiology and they recommended diltiazem infusion.  Consultants Cardiology   Procedures/Events None   Assessment and Plan: Atrial fibrillation with rapid ventricular response (HCC) Challenging case, previous history of bradycardia. Cardiology recommend discharge today but in the afternoon patient developed rapid ventricular rate in the 140s.  I discussed with cardiology who recommended IV diltiazem.  They will continue to follow. Continue apixaban.    Type 2 diabetes-non-insulin-dependent -Carb modified diet -Moderate dose sliding scale insulin A1c: 7.1   History of prostate cancer -managed at the [facility]   COPD -currently on room air, no evidence of exacerbation   AKI ruled out.    Subjective:  Gets hot flashes No CP No SOB  Physical Exam: Vitals:   05/18/23 1322 05/18/23 1600 05/18/23 1700 05/18/23 1800  BP: 115/88 131/72 (!) 148/99 (!) 118/92  Pulse: 65 (!) 115 (!) 126 (!) 147  Resp: 20 20 19 17   Temp: 97.8 F (36.6 C) (!) 97.5 F (36.4 C) 98.3 F (36.8 C)   TempSrc: Oral Oral Oral   SpO2: 100% 99%    Weight:      Height:       Physical Exam Vitals reviewed.  Constitutional:      General: He is not in acute distress.    Appearance: He is not ill-appearing or toxic-appearing.  Cardiovascular:     Rate and Rhythm: Tachycardia present. Rhythm irregular.     Heart sounds: No  murmur heard. Pulmonary:     Effort: Pulmonary effort is normal. No respiratory distress.     Breath sounds: No wheezing, rhonchi or rales.  Neurological:     Mental Status: He is alert.  Psychiatric:        Mood and Affect: Mood normal.        Behavior: Behavior normal.     Data Reviewed: CBG stable BMP noted  Family Communication: none  Disposition: Status is: Observation     Time spent: 35 minutes  Author: Brendia Sacks, MD 05/18/2023 6:40 PM  For on call review [email or web address].

## 2023-05-18 NOTE — Plan of Care (Signed)

## 2023-05-18 NOTE — Hospital Course (Addendum)
58M history of atrial fibrillation on Eliquis but not on rate control, admitted with A-fib RVR.  IV Cardizem drip.  Seen by cardiology, challenging case given tachybradycardia type syndrome.  Cardiology initially recommended discharge home with close outpatient follow-up and consideration of pacemaker placement by [facility] electrophysi[facility], however the patient developed A-fib RVR again 1/31.  I discussed the case with cardiology and they recommended diltiazem infusion.  Consultants Cardiology   Procedures/Events None

## 2023-05-18 NOTE — Evaluation (Signed)
Physical Therapy Evaluation Patient Details Name: Willie Harris MRN: [id number] DOB: 10-28-1935 Today's Date: 05/18/2023  History of Present Illness  88 yo male presents to therapy following hospital admission on 05/16/2023 [facility] due to symptomatic a-fib with RVR. Pt has returned to normal sinus rhythm with intermittent PACs and bradycardia. Pt has PMH including but not limiting to: COPD, HTN, DMII, prostate ca, A-fib, OSA, HLD and TIA.  Clinical Impression   Pt admitted with above diagnosis.  Pt currently with functional limitations due to the deficits listed below (see PT Problem List). Pt seated in recliner when PT arrived. Pt indicated no pain at rest. Pt stated no dizziness at rest however indicated that sometimes when he stands up he has some dizziness. Pt also stated that he thinks he may have to have a pacemaker placed. Pt agreeable to therapy intervention, PT able to incorporate modified orthostatic assessment during eval. Pt required cues and CGA for sit to stand  from recliner, CGA to min A due to one episode of LOB with use of personal SPC when transitioning into hallway, pt amb 60 feet and reported slight increase in dizziness with turn. Pt left seated in recliner, dizziness subsided and all needs in place. Pt will benefit from acute skilled PT to increase their independence and safety with mobility to allow discharge.    Bp seated in recliner 118/84 (79 PR) Bp immediate standing 122/86 (82 PR) Bp s/p 3 min standing 136/81 (78 PR)    If plan is discharge home, recommend the following: A little help with walking and/or transfers;A little help with bathing/dressing/bathroom;Assistance with cooking/housework;Assist for transportation;Help with stairs or ramp for entrance   Can travel by private vehicle        Equipment Recommendations None recommended by PT  Recommendations for Other Services       Functional Status Assessment Patient has had a recent  decline in their functional status and demonstrates the ability to make significant improvements in function in a reasonable and predictable amount of time.     Precautions / Restrictions Precautions Precautions: Fall Restrictions Weight Bearing Restrictions Per Provider Order: No      Mobility  Bed Mobility               General bed mobility comments: pt seated in recliner when PT arrived    Transfers Overall transfer level: Needs assistance Equipment used: Straight cane Transfers: Sit to/from Stand Sit to Stand: Contact guard assist           General transfer comment: min cues for push to stand, CGA for safety and pt using personal cane in R UE and wall on L side for stabiltiy and able to progress to static standing with 1 UE support with CGA and SPC.pt indicated some dizziness with initial standing and no change for the remainder of therapy session    Ambulation/Gait Ambulation/Gait assistance: Contact guard assist Gait Distance (Feet): 60 Feet Assistive device: Straight cane Gait Pattern/deviations: Step-to pattern, Staggering left, Staggering right Gait velocity: decreased     General Gait Details: gait assessed with pt personal SPC with cues and CGA, pt demonstrated LOB when transitioning from personal room to hallway with min A to recover, pt reported increased dizziness with turn in hallway and deneis any vestibular like symtoms  Stairs            Wheelchair Mobility     Tilt Bed    Modified Rankin (Stroke Patients Only)  Balance Overall balance assessment: History of Falls, Mild deficits observed, not formally tested (pt reports tripping over dog)                                           Pertinent Vitals/Pain Pain Assessment Pain Assessment: 0-10 Pain Score: 0-No pain    Home Living Family/patient expects to be discharged to:: Private residence Living Arrangements: Alone Available Help at Discharge:  Neighbor;Friend(s) (limited) Type of Home: Apartment Home Access: Stairs to enter Entrance Stairs-Rails: Right;Left Entrance Stairs-Number of Steps: flight   Home Layout: One level Home Equipment: Agricultural consultant (2 wheels);Cane - single point      Prior Function Prior Level of Function : Independent/Modified Independent             Mobility Comments: mod I with SPC for community navigation and furniture walking in apartment, all ADLs, self care tasks, IADLs, limited driving       Extremity/Trunk Assessment        Lower Extremity Assessment Lower Extremity Assessment: Generalized weakness    Cervical / Trunk Assessment Cervical / Trunk Assessment: Normal  Communication   Communication Communication: Hearing impairment  Cognition Arousal: Alert Behavior During Therapy: WFL for tasks assessed/performed Overall Cognitive Status: Within Functional Limits for tasks assessed                                          General Comments      Exercises     Assessment/Plan    PT Assessment Patient needs continued PT services  PT Problem List Decreased strength;Decreased activity tolerance;Decreased balance;Decreased mobility;Decreased coordination       PT Treatment Interventions DME instruction;Gait training;Stair training;Functional mobility training;Therapeutic activities;Therapeutic exercise;Balance training;Neuromuscular re-education;Patient/family education    PT Goals (Current goals can be found in the Care Plan section)  Acute Rehab PT Goals Patient Stated Goal: to get stronger and not have the dizziness PT Goal Formulation: With patient Time For Goal Achievement: 06/01/23 Potential to Achieve Goals: Good    Frequency Min 1X/week     Co-evaluation               AM-PAC PT "6 Clicks" Mobility  Outcome Measure Help needed turning from your back to your side while in a flat bed without using bedrails?: A Little Help needed moving  from lying on your back to sitting on the side of a flat bed without using bedrails?: A Little Help needed moving to and from a bed to a chair (including a wheelchair)?: A Little Help needed standing up from a chair using your arms (e.g., wheelchair or bedside chair)?: A Little Help needed to walk in hospital room?: A Little Help needed climbing 3-5 steps with a railing? : A Lot 6 Click Score: 17    End of Session Equipment Utilized During Treatment: Gait belt Activity Tolerance: Patient tolerated treatment well Patient left: in chair;with call bell/phone within reach;with chair alarm set Nurse Communication: Mobility status PT Visit Diagnosis: Unsteadiness on feet (R26.81);Other abnormalities of gait and mobility (R26.89);Muscle weakness (generalized) (M62.81);Difficulty in walking, not elsewhere classified (R26.2);History of falling (Z91.81)    Time: 4098-1191 PT Time Calculation (min) (ACUTE ONLY): 30 min   Charges:   PT Evaluation $PT Eval Low Complexity: 1 Low PT Treatments $Gait Training: 8-22 mins  PT General Charges $$ ACUTE PT VISIT: 1 Visit         Johnny Bridge, PT Acute Rehab   Jacqualyn Posey 05/18/2023, 12:12 PM

## 2023-05-19 DIAGNOSIS — J449 Chronic obstructive pulmonary disease, unspecified: Secondary | ICD-10-CM | POA: Diagnosis present

## 2023-05-19 DIAGNOSIS — I10 Essential (primary) hypertension: Secondary | ICD-10-CM | POA: Diagnosis present

## 2023-05-19 DIAGNOSIS — F419 Anxiety disorder, unspecified: Secondary | ICD-10-CM | POA: Diagnosis present

## 2023-05-19 DIAGNOSIS — I4891 Unspecified atrial fibrillation: Secondary | ICD-10-CM | POA: Diagnosis present

## 2023-05-19 DIAGNOSIS — Z96651 Presence of right artificial knee joint: Secondary | ICD-10-CM | POA: Diagnosis present

## 2023-05-19 DIAGNOSIS — R001 Bradycardia, unspecified: Secondary | ICD-10-CM | POA: Diagnosis not present

## 2023-05-19 DIAGNOSIS — G4733 Obstructive sleep apnea (adult) (pediatric): Secondary | ICD-10-CM | POA: Diagnosis present

## 2023-05-19 DIAGNOSIS — K219 Gastro-esophageal reflux disease without esophagitis: Secondary | ICD-10-CM | POA: Diagnosis present

## 2023-05-19 DIAGNOSIS — Z923 Personal history of irradiation: Secondary | ICD-10-CM | POA: Diagnosis not present

## 2023-05-19 DIAGNOSIS — Z7901 Long term (current) use of anticoagulants: Secondary | ICD-10-CM | POA: Diagnosis not present

## 2023-05-19 DIAGNOSIS — Z8546 Personal history of malignant neoplasm of prostate: Secondary | ICD-10-CM | POA: Diagnosis not present

## 2023-05-19 DIAGNOSIS — C61 Malignant neoplasm of prostate: Secondary | ICD-10-CM | POA: Diagnosis present

## 2023-05-19 DIAGNOSIS — Z7984 Long term (current) use of oral hypoglycemic drugs: Secondary | ICD-10-CM | POA: Diagnosis not present

## 2023-05-19 DIAGNOSIS — E1140 Type 2 diabetes mellitus with diabetic neuropathy, unspecified: Secondary | ICD-10-CM | POA: Diagnosis present

## 2023-05-19 DIAGNOSIS — Z8673 Personal history of transient ischemic attack (TIA), and cerebral infarction without residual deficits: Secondary | ICD-10-CM | POA: Diagnosis not present

## 2023-05-19 DIAGNOSIS — Z882 Allergy status to sulfonamides status: Secondary | ICD-10-CM | POA: Diagnosis not present

## 2023-05-19 DIAGNOSIS — Z1152 Encounter for screening for COVID-19: Secondary | ICD-10-CM | POA: Diagnosis not present

## 2023-05-19 DIAGNOSIS — I480 Paroxysmal atrial fibrillation: Secondary | ICD-10-CM | POA: Diagnosis present

## 2023-05-19 DIAGNOSIS — Z87891 Personal history of nicotine dependence: Secondary | ICD-10-CM | POA: Diagnosis not present

## 2023-05-19 DIAGNOSIS — Z881 Allergy status to other antibiotic agents status: Secondary | ICD-10-CM | POA: Diagnosis not present

## 2023-05-19 DIAGNOSIS — I4892 Unspecified atrial flutter: Secondary | ICD-10-CM | POA: Diagnosis present

## 2023-05-19 DIAGNOSIS — E785 Hyperlipidemia, unspecified: Secondary | ICD-10-CM | POA: Diagnosis present

## 2023-05-19 DIAGNOSIS — Z79899 Other long term (current) drug therapy: Secondary | ICD-10-CM | POA: Diagnosis not present

## 2023-05-19 DIAGNOSIS — Z7951 Long term (current) use of inhaled steroids: Secondary | ICD-10-CM | POA: Diagnosis not present

## 2023-05-19 DIAGNOSIS — D6869 Other thrombophilia: Secondary | ICD-10-CM | POA: Diagnosis present

## 2023-05-19 LAB — GLUCOSE, CAPILLARY
Glucose-Capillary: 115 mg/dL — ABNORMAL HIGH (ref 70–99)
Glucose-Capillary: 186 mg/dL — ABNORMAL HIGH (ref 70–99)
Glucose-Capillary: 115 mg/dL — ABNORMAL HIGH (ref 70–99)
Glucose-Capillary: 209 mg/dL — ABNORMAL HIGH (ref 70–99)

## 2023-05-19 MED ORDER — CYCLOSPORINE 0.05 % OP EMUL
1.0000 [drp] | Freq: Two times a day (BID) | OPHTHALMIC | Status: DC
  Administered 2023-05-19 – 2023-05-20 (×3): 1 [drp] via OPHTHALMIC
  Filled 2023-05-19 (×3): qty 30

## 2023-05-19 MED ORDER — PROSIGHT PO TABS
1.0000 | ORAL_TABLET | Freq: Two times a day (BID) | ORAL | Status: DC
  Administered 2023-05-19 – 2023-05-20 (×3): 1 via ORAL
  Filled 2023-05-19 (×3): qty 1

## 2023-05-19 MED ORDER — ORAL CARE MOUTH RINSE: 15.0000 mL | OROMUCOSAL | Status: DC | PRN

## 2023-05-19 NOTE — Progress Notes (Signed)
Received call from telemetry that patient HR has converted to sinus brady with HR 49. Pt assessed, HR sustaining in the 40s. Alert and oriented. Denies any distress. NP on call made aware. Cardizem drip stopped. Patient HR sustaining at 50's at this time.

## 2023-05-19 NOTE — Progress Notes (Signed)
Mobility Specialist - Progress Note  Post-mobility: 67 bpm HR, 118/72 mmHg (87 MAP) BP,    05/19/23 1351  Orthostatic Sitting  BP- Sitting 110/72  Pulse- Sitting 72  Orthostatic Standing at 0 minutes  BP- Standing at 0 minutes (!) 108/95  Pulse- Standing at 0 minutes 83  Mobility  Activity Ambulated with assistance in hallway  Level of Assistance Contact guard assist, steadying assist  Assistive Device Cane  Distance Ambulated (ft) 90 ft  Range of Motion/Exercises Active  Activity Response Tolerated fair  Mobility Referral Yes  Mobility visit 1 Mobility  Mobility Specialist Start Time (ACUTE ONLY) 1330  Mobility Specialist Stop Time (ACUTE ONLY) 1350  Mobility Specialist Time Calculation (min) (ACUTE ONLY) 20 min   Pt was found in bed and agreeable to ambulate. BP taken sitting and standing recorded above. Pt stated feeling "woozy" throughout session. At EOS returned to bed with all needs met. Call bell in reach.  Billey Chang Mobility Specialist

## 2023-05-19 NOTE — Progress Notes (Signed)
  Progress Note   Patient: Willie Harris ZOX:[id number] DOB: 1935-05-26 DOA: 05/16/2023     0 DOS: the patient was seen and examined on 05/19/2023   Brief hospital course: 47M history of atrial fibrillation on Eliquis but not on rate control, admitted with A-fib RVR.  IV Cardizem drip.  Seen by cardiology, challenging case given tachybradycardia type syndrome.  Cardiology initially recommended discharge home with close outpatient follow-up and consideration of pacemaker placement by [facility], however the patient developed A-fib RVR again 1/31.  I discussed the case with cardiology and they recommended diltiazem infusion.  Consultants Cardiology   Procedures/Events None   Assessment and Plan: Atrial fibrillation with rapid ventricular response (HCC) Challenging case, previous history of bradycardia. Cardiology recommend discharge 1/31 but in the afternoon patient developed rapid ventricular rate in the 140s.   Treated with diltiazem and converted back to sinus rhythm early this morning. Continue apixaban. Await cardiology input given recurrent A-fib.  High risk for rehospitalization and arrhythmia.    Type 2 diabetes-non-insulin-dependent Carb modified diet Continue sliding scale insulin.   History of prostate cancer -managed at the [facility]   COPD -currently on room air, no evidence of exacerbation   AKI ruled out.      Subjective:  Feels fine Converted to sinus rhythm this morning, had bradycardia and Cardizem was stopped.  Physical Exam: Vitals:   05/19/23 0433 05/19/23 0700 05/19/23 0800 05/19/23 1200  BP: 113/60     Pulse:   (!) 52   Resp: 16     Temp:  97.9 F (36.6 C) 98.5 F (36.9 C) 97.9 F (36.6 C)  TempSrc:  Oral Oral Oral  SpO2:      Weight:      Height:       Physical Exam Vitals reviewed.  Constitutional:      General: He is not in acute distress.    Appearance: He is not ill-appearing or toxic-appearing.  Cardiovascular:      Rate and Rhythm: Normal rate and regular rhythm.     Heart sounds: No murmur heard.    Comments: Telemetry SR Pulmonary:     Effort: Pulmonary effort is normal. No respiratory distress.     Breath sounds: No wheezing, rhonchi or rales.  Neurological:     Mental Status: He is alert.  Psychiatric:        Mood and Affect: Mood normal.        Behavior: Behavior normal.     Data Reviewed: CBG stable  Family Communication: none  Disposition: Status is: Inpatient     Time spent: 35 minutes  Author: Brendia Sacks, MD 05/19/2023 6:45 PM  For on call review [email or web address].

## 2023-05-19 NOTE — Plan of Care (Signed)
  Problem: Fluid Volume: Goal: Ability to maintain a balanced intake and output will improve Outcome: Progressing   Problem: Fluid Volume: Goal: Ability to maintain a balanced intake and output will improve Outcome: Progressing   Problem: Health Behavior/Discharge Planning: Goal: Ability to identify and utilize available resources and services will improve Outcome: Progressing

## 2023-05-20 DIAGNOSIS — I4891 Unspecified atrial fibrillation: Secondary | ICD-10-CM | POA: Diagnosis not present

## 2023-05-20 LAB — GLUCOSE, CAPILLARY
Glucose-Capillary: 127 mg/dL — ABNORMAL HIGH (ref 70–99)
Glucose-Capillary: 118 mg/dL — ABNORMAL HIGH (ref 70–99)

## 2023-05-20 MED ORDER — DRONEDARONE HCL 400 MG PO TABS: 400.0000 mg | ORAL_TABLET | Freq: Two times a day (BID) | ORAL | 0 refills | Status: AC

## 2023-05-20 MED ORDER — APIXABAN 5 MG PO TABS: 5.0000 mg | ORAL_TABLET | Freq: Two times a day (BID) | ORAL | Status: AC

## 2023-05-20 MED ORDER — SALINE SPRAY 0.65 % NA SOLN: 1.0000 | NASAL | Status: DC | PRN

## 2023-05-20 MED ORDER — DRONEDARONE HCL 400 MG PO TABS
400.0000 mg | ORAL_TABLET | Freq: Two times a day (BID) | ORAL | Status: DC
  Administered 2023-05-20: 400 mg via ORAL
  Filled 2023-05-20 (×2): qty 1

## 2023-05-20 NOTE — Progress Notes (Signed)
Rounding Note    Patient Name: Willie Harris Date of Encounter: 05/20/2023  [facility] Cardiologist: None   Subjective   Currently feeling well with only mild headache  Inpatient Medications    Scheduled Meds:  apixaban  5 mg Oral BID   cycloSPORINE  1 drop Both Eyes BID   insulin aspart  0-5 Units Subcutaneous QHS   insulin aspart  0-9 Units Subcutaneous TID WC   melatonin  3 mg Oral QHS   mometasone-formoterol  2 puff Inhalation BID   multivitamin  1 tablet Oral BID   pantoprazole  40 mg Oral Daily   Continuous Infusions:  diltiazem (CARDIZEM) infusion Stopped (05/19/23 0454)   PRN Meds: acetaminophen **OR** acetaminophen, albuterol, clonazePAM, meclizine, ondansetron **OR** ondansetron (ZOFRAN) IV, mouth rinse, traZODone   Vital Signs    Vitals:   05/19/23 2018 05/20/23 0010 05/20/23 0400 05/20/23 0739  BP: 126/68 115/77 107/63   Pulse:      Resp: 16 18 18    Temp: 98.9 F (37.2 C) 98 F (36.7 C) 99.2 F (37.3 C) 97.9 F (36.6 C)  TempSrc: Oral Oral Oral Oral  SpO2: 97% 97% 96%   Weight:      Height:        Intake/Output Summary (Last 24 hours) at 05/20/2023 0748 Last data filed at 05/20/2023 0436 Gross per 24 hour  Intake 240 ml  Output 500 ml  Net -260 ml      05/16/2023    9:57 PM 01/30/2023    8:31 AM 10/05/2022   10:59 AM  Last 3 Weights  Weight (lbs) 166 lb 166 lb 8 oz 169 lb 1.5 oz  Weight (kg) 75.297 kg 75.524 kg 76.7 kg      Telemetry    Sinus rhythm- Personally Reviewed  ECG    None new- Personally Reviewed  Physical Exam   GEN: No acute distress.   Neck: No JVD Cardiac: RRR, no murmurs, rubs, or gallops.  Respiratory: Clear to auscultation bilaterally. GI: Soft, nontender, non-distended  MS: No edema; No deformity. Neuro:  Nonfocal  Psych: Normal affect   Labs    High Sensitivity Troponin:  No results for input(s): "TROPONINIHS" in the last 720 hours.   Chemistry Recent Labs  Lab 05/16/23 1230  05/17/23 0509 05/18/23 0352  NA 140 138 135  K 3.5 4.5 3.6  CL 106 106 103  CO2 24 26 25   GLUCOSE 111* 129* 121*  BUN 19 23 22   CREATININE 1.16 1.29* 1.10  CALCIUM 9.3 9.3 9.7  MG 2.1  --   --   GFRNONAA >60 54* >60  ANIONGAP 10 6 7     Lipids No results for input(s): "CHOL", "TRIG", "HDL", "LABVLDL", "LDLCALC", "CHOLHDL" in the last 168 hours.  Hematology Recent Labs  Lab 05/16/23 1230 05/17/23 0509  WBC 6.7 6.1  RBC 4.89 4.51  HGB 14.4 13.4  HCT 44.9 41.3  MCV 91.8 91.6  MCH 29.4 29.7  MCHC 32.1 32.4  RDW 13.7 13.9  PLT 173 151   Thyroid No results for input(s): "TSH", "FREET4" in the last 168 hours.  BNP Recent Labs  Lab 05/16/23 1230  BNP 86.8    DDimer No results for input(s): "DDIMER" in the last 168 hours.   Radiology    No results found.  Cardiac Studies   TTE  1. Low normal to mildly reduced LV function; EF 50.   2. Left ventricular ejection fraction, by estimation, is 50 to 55%. The  left ventricle has low normal function. The left ventricle has no regional  wall motion abnormalities. Left ventricular diastolic parameters were  normal.   3. Right ventricular systolic function is normal. The right ventricular  size is normal. There is normal pulmonary artery systolic pressure.   4. The mitral valve is normal in structure. Trivial mitral valve  regurgitation. No evidence of mitral stenosis.   5. The aortic valve is tricuspid. Aortic valve regurgitation is not  visualized. Aortic valve sclerosis is present, with no evidence of aortic  valve stenosis.   6. Aortic dilatation noted. There is borderline dilatation of the aortic  root, measuring 38 mm.   7. The inferior vena cava is normal in size with greater than 50%  respiratory variability, suggesting right atrial pressure of 3 mmHg.   Patient Profile     88 y.o. male history of COPD, hypertension, atrial fibrillation/flutter presented to the hospital with atrial fibrillation and rapid  rates  Assessment & Plan    1.  Paroxysmal atrial fibrillation/flutter: Has had rapid rates in the past.  Has also had some episodes of bradycardia.  Fortunately he is in normal rhythm today.  Based on telemetry, he went into sinus yesterday morning.  On no rate controlling medicines due to a history of a bradycardia.  For now, would continue Eliquis.  Lashai Grosch start Multaq 400 mg twice daily.  He does have an electrophysiologist that he Jeanenne Licea need to arrange for to see after discharge from hospital, may need pacemaker implantation if he does not stay in rhythm with Multaq.  [facility] Rayquon Uselman sign off.   Medication Recommendations: Multaq 400 mg twice daily Other recommendations (labs, testing, etc): None Follow up as an outpatient: Primary electrophysiologist  For questions or updates, please contact [facility] Please consult [email or web address] for contact info under        Signed, Chloe Miyoshi Jorja Loa, MD  05/20/2023, 7:48 AM

## 2023-05-20 NOTE — Plan of Care (Signed)
  Problem: Coping: Goal: Ability to adjust to condition or change in health will improve Outcome: Adequate for Discharge   Problem: Fluid Volume: Goal: Ability to maintain a balanced intake and output will improve Outcome: Adequate for Discharge   Problem: Health Behavior/Discharge Planning: Goal: Ability to manage health-related needs will improve Outcome: Adequate for Discharge   Problem: Metabolic: Goal: Ability to maintain appropriate glucose levels will improve Outcome: Adequate for Discharge   Problem: Nutritional: Goal: Maintenance of adequate nutrition will improve Outcome: Adequate for Discharge

## 2023-05-20 NOTE — TOC Transition Note (Signed)
Transition of Care [facility] - Discharge Note   Patient Details  Name: Willie Harris MRN: [id number] Date of Birth: 1935/05/30  Transition of Care [facility]) CM/SW Contact:  Larrie Kass, LCSW Phone Number: 05/20/2023, 12:57 PM   Clinical Narrative:    CSW spoke with the pt to discuss home health recommendations. Pt reported that he has an Charity fundraiser who comes to help with medication management but does not know the name of the agency providing these services. Pt plans to follow up with his PCP to arrange home health services. Pt also reported having transportation at the time of discharge. No further TOC needs; TOC sign-off.    Final next level of care: Home/Self Care Barriers to Discharge: Barriers Resolved   Patient Goals and CMS Choice            Discharge Placement                       Discharge Plan and Services Additional resources added to the After Visit Summary for                                       Social Drivers of Health (SDOH) Interventions SDOH Screenings   Food Insecurity: No Food Insecurity (05/17/2023)  Housing: Low Risk  (05/17/2023)  Transportation Needs: No Transportation Needs (05/17/2023)  Utilities: Not At Risk (05/17/2023)  Alcohol Screen: Low Risk  (01/30/2023)  Depression (PHQ2-9): Low Risk  (01/30/2023)  Social Connections: Socially Isolated (05/17/2023)  Tobacco Use: Medium Risk (05/16/2023)     Readmission Risk Interventions     No data to display

## 2023-05-20 NOTE — Discharge Summary (Signed)
Physician Discharge Summary   Patient: Willie Harris MRN: [id number] DOB: February 20, 1936  Admit date:     05/16/2023  Discharge date: 05/20/23  Discharge Physician: Brendia Sacks   PCP: [facility]   Recommendations at discharge:   Started on Multaq for atrial fibrillation with rapid ventricular response.  Close outpatient follow-up with electrophysiologist recommended.  See discussion below.  Discharge Diagnoses: Principal Problem:   Atrial fibrillation with rapid ventricular response (HCC) Active Problems:   Atrial fibrillation with RVR (HCC)  Resolved Problems:   * No resolved hospital problems. [facility] Course: 71M history of atrial fibrillation on Eliquis but not on rate control, admitted with A-fib RVR.  IV Cardizem drip.  Seen by cardiology, challenging case given tachybradycardia type syndrome.  Cardiology initially recommended discharge home with close outpatient follow-up and consideration of pacemaker placement by [facility], however the patient developed A-fib RVR again 1/31.  Converted on diltiazem infusion.  He was seen again by cardiology and started on Multaq.  Discharged home in stable condition.  Consultants Cardiology   Procedures/Events None   Atrial fibrillation with rapid ventricular response (HCC) Challenging case, previous history of bradycardia. Cardiology recommended discharge 1/31 but in the afternoon patient developed rapid ventricular rate in the 140s.   Treated with diltiazem and converted back to sinus rhythm  Cardiology started Multaq.  Continue apixaban. Close follow-up with electrophysiologist as an outpatient.   Type 2 diabetes-non-insulin-dependent Carb modified diet   History of prostate cancer -managed at the [facility]   COPD -currently on room air, no evidence of exacerbation   AKI ruled out.  Disposition: Home Diet recommendation:  Carb modified diet DISCHARGE MEDICATION: Allergies as of 05/20/2023        Reactions   Doxycycline Itching   Sulfa Antibiotics Other (See Comments)   Caused skin on hand to get "really tight"        Medication List     STOP taking these medications    amLODipine 5 MG tablet Commonly known as: NORVASC       TAKE these medications    albuterol 108 (90 Base) MCG/ACT inhaler Commonly known as: VENTOLIN HFA Inhale 1-2 puffs into the lungs every 6 (six) hours as needed for wheezing or shortness of breath.   apixaban 5 MG Tabs tablet Commonly known as: ELIQUIS Take 1 tablet (5 mg total) by mouth 2 (two) times daily.   budesonide-formoterol 160-4.5 MCG/ACT inhaler Commonly known as: SYMBICORT Inhale 2 puffs into the lungs 2 (two) times daily.   clonazePAM 1 MG tablet Commonly known as: KLONOPIN Take 0.5 tablets (0.5 mg total) by mouth 2 (two) times daily.   cycloSPORINE 0.05 % ophthalmic emulsion Commonly known as: RESTASIS Apply to eye.   diclofenac Sodium 1 % Gel Commonly known as: VOLTAREN Apply topically.   dronedarone 400 MG tablet Commonly known as: MULTAQ Take 1 tablet (400 mg total) by mouth 2 (two) times daily with a meal.   FreeStyle Libre 2 Sensor Misc Inject 1 Device into the skin every 14 (fourteen) days.   gabapentin 100 MG capsule Commonly known as: NEURONTIN Take by mouth.   meclizine 25 MG tablet Commonly known as: ANTIVERT Take 0.5 tablets (12.5 mg total) by mouth 3 (three) times daily as needed for dizziness.   melatonin 3 MG Tabs tablet Take 3 mg by mouth at bedtime. Take 3 tablets (9 mg) by mouth at bedtime for Insomnia.   pantoprazole 40 MG tablet Commonly known as: PROTONIX Take by  mouth.   pravastatin 40 MG tablet Commonly known as: PRAVACHOL Take 40 mg by mouth daily.   PreserVision AREDS 2+Multi Vit Caps Take 1 capsule by mouth 2 (two) times daily.   PSYLLIUM FIBER PO Take by mouth.   SITagliptin 50 MG Tabs Take by mouth.   tadalafil 20 MG tablet Commonly known as: CIALIS Take 20 mg  by mouth.   tamsulosin 0.4 MG Caps capsule Commonly known as: FLOMAX Take by mouth.        Follow-up Information     [facility] Schedule an appointment as soon as possible for a visit in 1 week(s).   Contact information: [address] [facility] [place] [address] [phone]         Sherryl Manges, MD. Schedule an appointment as soon as possible for a visit in 1 week(s).   Specialty: Cardiology Contact information: [address] [place] [facility] [place] [address] [phone]                Feels ok  Discharge Exam: Filed Weights   05/16/23 2157  Weight: 75.3 kg   Physical Exam Vitals reviewed.  Constitutional:      General: He is not in acute distress.    Appearance: He is not ill-appearing or toxic-appearing.  Cardiovascular:     Rate and Rhythm: Normal rate and regular rhythm.     Heart sounds: No murmur heard. Pulmonary:     Effort: Pulmonary effort is normal. No respiratory distress.     Breath sounds: No wheezing, rhonchi or rales.  Neurological:     Mental Status: He is alert.  Psychiatric:        Mood and Affect: Mood normal.        Behavior: Behavior normal.      Condition at discharge: good  The results of significant diagnostics from this hospitalization (including imaging, microbiology, ancillary and laboratory) are listed below for reference.   Imaging Studies: DG Chest 2 View Result Date: 05/16/2023 CLINICAL DATA:  Shortness of breath. EXAM: CHEST - 2 VIEW COMPARISON:  Chest radiograph dated May 13, 2022. FINDINGS: The heart size and mediastinal contours are within normal limits. Aortic atherosclerosis. No focal consolidation, pleural effusion, or pneumothorax. Multilevel degenerative changes of the spine. No acute osseous abnormality. IMPRESSION: No acute cardiopulmonary findings. Electronically Signed   By: Hart Robinsons M.D.   On: 05/16/2023 14:06    Microbiology: Results for orders placed  or performed during the hospital encounter of 05/16/23  Resp panel by RT-PCR (RSV, Flu A&B, Covid) Anterior Nasal Swab     Status: None   Collection Time: 05/16/23 12:30 PM   Specimen: Anterior Nasal Swab  Result Value Ref Range Status   SARS Coronavirus 2 by RT PCR NEGATIVE NEGATIVE Final    Comment: (NOTE) SARS-CoV-2 target nucleic acids are NOT DETECTED.  The SARS-CoV-2 RNA is generally detectable in upper respiratory specimens during the acute phase of infection. The lowest concentration of SARS-CoV-2 viral copies this assay can detect is 138 copies/mL. A negative result does not preclude SARS-Cov-2 infection and should not be used as the sole basis for treatment or other patient management decisions. A negative result may occur with  improper specimen collection/handling, submission of specimen other than nasopharyngeal swab, presence of viral mutation(s) within the areas targeted by this assay, and inadequate number of viral copies(<138 copies/mL). A negative result must be combined with clinical observations, patient history, and epidemiological information. The expected result is Negative.  Fact Sheet for  Patients:  BloggerCourse.com  Fact Sheet for Healthcare Providers:  SeriousBroker.it  This test is no t yet approved or cleared by the [place] FDA and  has been authorized for detection and/or diagnosis of SARS-CoV-2 by FDA under an Emergency Use Authorization (EUA). This EUA will remain  in effect (meaning this test can be used) for the duration of the COVID-19 declaration under Section 564(b)(1) of the Act, 21 U.S.C.section 360bbb-3(b)(1), unless the authorization is terminated  or revoked sooner.       Influenza A by PCR NEGATIVE NEGATIVE Final   Influenza B by PCR NEGATIVE NEGATIVE Final    Comment: (NOTE) The Xpert Xpress SARS-CoV-2/FLU/RSV plus assay is intended as an aid in the diagnosis of influenza from  Nasopharyngeal swab specimens and should not be used as a sole basis for treatment. Nasal washings and aspirates are unacceptable for Xpert Xpress SARS-CoV-2/FLU/RSV testing.  Fact Sheet for Patients: BloggerCourse.com  Fact Sheet for Healthcare Providers: SeriousBroker.it  This test is not yet approved or cleared by the [place] FDA and has been authorized for detection and/or diagnosis of SARS-CoV-2 by FDA under an Emergency Use Authorization (EUA). This EUA will remain in effect (meaning this test can be used) for the duration of the COVID-19 declaration under Section 564(b)(1) of the Act, 21 U.S.C. section 360bbb-3(b)(1), unless the authorization is terminated or revoked.     Resp Syncytial Virus by PCR NEGATIVE NEGATIVE Final    Comment: (NOTE) Fact Sheet for Patients: BloggerCourse.com  Fact Sheet for Healthcare Providers: SeriousBroker.it  This test is not yet approved or cleared by the [place] FDA and has been authorized for detection and/or diagnosis of SARS-CoV-2 by FDA under an Emergency Use Authorization (EUA). This EUA will remain in effect (meaning this test can be used) for the duration of the COVID-19 declaration under Section 564(b)(1) of the Act, 21 U.S.C. section 360bbb-3(b)(1), unless the authorization is terminated or revoked.  Performed at [facility], [address] [place], [place] [address]     Labs: CBC: Recent Labs  Lab 05/16/23 1230 05/17/23 0509  WBC 6.7 6.1  HGB 14.4 13.4  HCT 44.9 41.3  MCV 91.8 91.6  PLT 173 151   Basic Metabolic Panel: Recent Labs  Lab 05/16/23 1230 05/17/23 0509 05/18/23 0352  NA 140 138 135  K 3.5 4.5 3.6  CL 106 106 103  CO2 24 26 25   GLUCOSE 111* 129* 121*  BUN 19 23 22   CREATININE 1.16 1.29* 1.10  CALCIUM 9.3 9.3 9.7  MG 2.1  --   --    Liver Function Tests: No  results for input(s): "AST", "ALT", "ALKPHOS", "BILITOT", "PROT", "ALBUMIN" in the last 168 hours. CBG: Recent Labs  Lab 05/19/23 1135 05/19/23 1731 05/19/23 2017 05/20/23 0736 05/20/23 1237  GLUCAP 186* 115* 209* 127* 118*    Discharge time spent: less than 30 minutes.  Signed: Brendia Sacks, MD [facility] 05/20/2023

## 2024-01-10 NOTE — Discharge Summary (Addendum)
 "  [facility]  Discharge Summary  PCP: Refugia Lofts (Inactive) Discharge Details   Admit date:         01/09/2024 Discharge date and time:       01/11/2024 Hospital LOS:    3  days  Active Hospital Problems   Diagnosis Date Noted POA   *Dysarthria 01/09/2024 Yes   Right thalamic infarction (*) 01/09/2024 Unknown    Resolved Hospital Problems  No resolved problems to display.      Current Discharge Medication List     START taking these medications      Details  amLODIPine  besylate 5 mg tablet Commonly known as: NORVASC  Start taking on: January 12, 2024  Take one tablet (5 mg dose) by mouth daily. Quantity: 30 tablet   aspirin  EC tablet Commonly known as: ECOTRIN LOW DOSE Start taking on: January 12, 2024  Take one tablet (81 mg dose) by mouth daily. Quantity: 30 tablet       CONTINUE these medications which have NOT CHANGED      Details  clonazePAM  1 mg tablet Commonly known as: KLONOPIN   Take one tablet (1 mg dose) by mouth 2 (two) times a day as needed for Anxiety.   cycloSPORINE  0.05 % ophthalmic emulsion Commonly known as: RESTASIS   Place one drop into both eyes every 12 (twelve) hours.   ELIQUIS  5 mg tablet Generic drug: apixaban   Take one tablet (5 mg dose) by mouth 2 (two) times daily.   FLOMAX 0.4 mg Caps Generic drug: tamsulosin  Take one capsule (0.4 mg dose) by mouth daily.   gabapentin 100 mg capsule Commonly known as: NEURONTIN  Take one capsule (100 mg dose) by mouth at bedtime.   metoprolol  succinate 50 mg 24 hr tablet Commonly known as: TOPROL -XL  Take one half tablet (25 mg dose) by mouth daily.   omeprazole 20 mg capsule Commonly known as: PRILOSEC  Take one capsule (20 mg dose) by mouth daily.   rosuvastatin calcium 40 mg tablet Commonly known as: CRESTOR  Take one tablet (40 mg dose) by mouth at bedtime.   sitaGLIPtin 50 mg tablet Commonly known as:  JANUVIA  Take one tablet (50 mg dose) by mouth daily.      * You might also be taking other medications not listed above. If you have questions about any of your other medications, talk to the person who prescribed them or your Primary Care Provider.             Reason for medication changes:  Hospital Course   Indication for Admission/chief complaint: Slurred speech and left facial droop   History of Present Illness:  Willie Harris is a 88 y.o. male with history of essential hypertension, and atrial fibrillation presented to the [place] clinic this morning for evaluation of slurred speech, facial droop, and fatigue.  His primary care manager noticed that he had a left facial droop with a change in his mental status.  As a result EMS was alerted and brought the patient [facility].  The last known normal of the slurred speech and none left facial droop is unknown.  Willie Harris reported he was feeling more sluggish than usual and the speech slurring with a facial droop prompted his concern to see a physician.  He reports that he has not seen a healthcare provider for several months.  He reports a history of stroke.  He denies nausea, vomiting, gait abnormality, difficulty  swallowing, neck pain, double vision, numbness or weakness.   Lab eval ration at [facility] showed hyperglycemia of 192 mg/dL.  There is no electrolyte derangement.  There is no elevation of liver function.  The patient does not have an elevated white blood cell count however is found anemic with a hemoglobin of 12.8 and hematocrit of 39.5.  The differential is within normal limits.  The patient's prothrombin time is 17.2.  CT head without contrast was completed that showed no acute intracranial abnormality.  A chest x-ray shows no radiographic evidence of acute cardiopulmonary disease.  An MRI of the head without contrast shows no acute findings.  An EKG shows a ventricular rate of 49 bpm with sinus  bradycardia with sinus arrhythmia.  Otherwise normal EKG.  As result of the slurred speech with the left facial droop and weakness Willie Harris will be admitted to [facility] and will be evaluated by neurology.   Review of Systems: Review of Systems  Constitutional:  Positive for malaise/fatigue. Negative for chills, diaphoresis, fever and weight loss.  HENT: Negative.  Negative for congestion, ear discharge, ear pain, hearing loss, nosebleeds, sinus pain, sore throat and tinnitus.   Eyes: Negative.  Negative for blurred vision, double vision, photophobia, pain, discharge and redness.  Respiratory: Negative.  Negative for cough, hemoptysis, sputum production, shortness of breath, wheezing and stridor.   Cardiovascular: Negative.  Negative for chest pain, palpitations, orthopnea, claudication, leg swelling and PND.  Gastrointestinal: Negative.  Negative for abdominal pain, blood in stool, constipation, diarrhea, heartburn, melena, nausea and vomiting.  Genitourinary: Negative.  Negative for dysuria, flank pain, frequency, hematuria and urgency.  Musculoskeletal: Negative.  Negative for back pain, falls, joint pain, myalgias and neck pain.  Skin: Negative.  Negative for itching and rash.  Neurological:  Positive for focal weakness and weakness. Negative for dizziness, tingling, tremors, sensory change, speech change, seizures, loss of consciousness and headaches.  Endo/Heme/Allergies: Negative.  Negative for environmental allergies and polydipsia. Does not bruise/bleed easily.  Psychiatric/Behavioral: Negative.  Negative for depression, hallucinations, memory loss, substance abuse and suicidal ideas. The patient is not nervous/anxious and does not have insomnia.    All others reviewed and negative.  Hospital Course:       He was admitted to the hospital for further management and evaluation. Medical issues addressed during this hospitalization are summarized below:  Dysarthria - he was seen in  consultation by neurology and initial recs were as follows: likely transient dysarthria and possible left facial weakness noted yesterday in the context of history of a right thalamic stroke in the past, without acute changes on MRI of the brain.  Most likely symptoms were due to recrudescence of old stroke symptoms to a minor medical stressor for example significant bradycardia.  For unknown reasons the patient is on aspirin  and Plavix despite having history of atrial fibrillation and flutter.  He used to be on Eliquis  and I am not finding any explanation on chart for the switch to DAPT.  No mention of hemorrhagic complications on patient's problem list.  According to cardiology progress note from May of this year the patient was at that time appropriately anticoagulated for CHA2DS2-VASc score of 6.     Right thalamic infarction (*)     Recommendations    Management of bradycardia and other medical issues per medicine service.   Defer additional stroke risk factor workup.   Okay to switch  from DAPT to Eliquis .   Neurology will follow.  on date  of d/c Neurology recs as follows: Recommend outpatient follow up with Neurology for further evaluation and EMG to evaluate for neuromuscular disease process such as myasthenia gravis (low probability). may need ENT follow up to evaluate for vocal cord pathology/paralasys   he has Afib and states he is on Eliquis  but nothing was mentioned in the admission notes or in the original neurology evaluation this morning. Instead he is currently on Plavix here. Likely will require back on [facility] and single antiplatelet.  It was confirmed with patient that he takes Eliquis  at home and is not on Plavix No notes on file  Recommendations to physicians/followup needed: He will followup with his PCP at [facility] within the net 7-10 days    Physical Exam: Vitals:   01/11/24 1123  BP: (!) 159/79  Pulse: 58  Resp: 17  Temp: 98.1 F (36.7 C)  SpO2: 97%   Gen NAD Resp  CTAB CV RRR GI soft, +BS, NT Neuro AAO Psych normal mood and affect  Labs on Discharge:  Recent Labs    Units 01/10/24 0111 01/09/24 1422  WBC thou/mcL 6.0 6.2  HGB gm/dL 87.9* 87.1*  HCT % 62.5* 39.5*  PLT thou/mcL 156 161   Recent Labs    Units 01/10/24 0110 01/09/24 1422  NA mmol/L 141 139  K mmol/L 4.2 4.1  CL mmol/L 106 105  CO2 mmol/L 25 23  BUN mg/dL 14 14  CREATININE mg/dL 8.99 9.09  CALCIUM mg/dL 9.2 9.4   Recent Labs    Units 01/10/24 0110 01/09/24 1422  BILITOT mg/dL 0.5 0.6  AST U/L 17 21  ALT U/L 12 15  ALKPHOS U/L 62 71  ALBUMIN gm/dL 3.5 3.9   Recent Labs    Units 01/10/24 0111  HGBA1C % 7.7*   Recent Labs    Units 01/09/24 1422  INR  1.4  PTT second(s) 30   Recent Labs    Units 01/10/24 0824  CHOL mg/dL 885  LDL mg/dL 52  HDL mg/dL 48  TRIG mg/dL 72   No results for input(s): TROPONIN, CK in the last 168 hours.  Invalid input(s): CK-MB  Diagnostics:   Echocardiogram Complete WO Enhancing Agent  Final Result  Left Atrium: Injection of agitated saline documents no interatrial   shunt.    Left Ventricle: Systolic function is normal. EF: 55-60%.      CT Angio Head Neck  Final Result  IMPRESSION:  1. Some anatomic variation of the posterior circulation. Minimal atherosclerosis at the arch and in the neck but no stenosis of the head and neck vessels. No intracranial stenosis, aneurysm or vascular occlusion.  2. Thyromegaly. COPD. Cervical spondylosis.      Degree of stenosis is determined using NASCET measurement technique:  Severe:  70-90%  Moderate:  50-69%  Mild:  Less than 50%      VIZ AI was utilized by the stroke team for assistance with determination of the presence or absence of large vessel occlusion.      Electronically Signed by: Franky Cheshire MD, PHD on 01/10/2024 12:20 PM    MRI Head WO Contrast  Final Result  IMPRESSION:  1. No acute findings    Electronically Signed by: Glendia Guillaume, MD on  01/09/2024 6:33 PM    CT Head WO Contrast  Final Result  IMPRESSION:  No acute intracranial abnormality.    Electronically Signed by: Elsie Dayhoff, MD on 01/09/2024 4:14 PM    XR Chest Ap Portable  Final Result  IMPRESSION: No radiographic  evidence of acute cardiopulmonary disease.     Electronically Signed by: Glendia Guillaume, MD on 01/09/2024 3:05 PM        Post Hospital Care   Discharge Procedure Orders  Ambulatory Referral to Home Health  Standing Status: Future  Referral Priority: Routine Referral Type: Home Health Care  Referral Reason: Evaluate and Return  Requested Specialty: Home Health Services  Number of Visits Requested: 1 Expiration Date: 07/08/24   Consistent Carbohydrate Diet (diabetic)   Notify physician (specify)  Order Comments: If you develop fevers, chills, chest pain, shortness of breath   Activity as tolerated   Discharge instructions  Order Comments: Patient will followup with his PCP within the next 1-2 weeks    Diet: Cardiac diet Consistent Carbohydrate Diet (diabetic)      Code Status:   Full Code Disposition: Home Consults: Neurology  Followup appointments: No future appointments.   Time spent in discharge process:  total time spent 45 minutes   Electronically signed: Richardo KATHEE Essex, MD 01/11/2024 / 12:50 PM   *Some images could not be shown."

## 2024-01-11 NOTE — Nursing Note (Signed)
 Per pt request, called DC unit to have pt wait downstairs. DC unit stated understanding with fact that pt has medications that need to be picked up from pharmacy and that he requires a ride to get to his house. Primary RN Josh aware of all aspects of this plan.  Willie Harris 01/11/2024 / 3:23 PM

## 2024-01-13 NOTE — Progress Notes (Signed)
 CARE COORDINATOR CONTACT WITH PATIENT/CAREGIVER AFTER HOSPITAL DISCHARGE   Patient contacted. Pt denies headaches or visual issues. He states he has numbness/tingling in both feet, but states this has been an ongoing issue and is not a new symptom. New medications reviewed. Pt states [facility] nurse came out this morning and organized his medications. Encouraged to schedule Hospital follow up appointment with the [facility]. Pt states he will call tomorrow, 01/14/24, to schedule a follow-up with Dr. Jayson Ill with the [facility].

## 2024-05-16 ENCOUNTER — Other Ambulatory Visit: Payer: Self-pay

## 2024-05-16 ENCOUNTER — Encounter (HOSPITAL_COMMUNITY): Payer: Self-pay | Admitting: Emergency Medicine

## 2024-05-16 ENCOUNTER — Emergency Department (HOSPITAL_COMMUNITY)
Admission: EM | Admit: 2024-05-16 | Discharge: 2024-05-16 | Disposition: A | Discharge status: Home / Self Care | Attending: Emergency Medicine | Admitting: Emergency Medicine

## 2024-05-16 DIAGNOSIS — W19XXXA Unspecified fall, initial encounter: Secondary | ICD-10-CM

## 2024-05-16 DIAGNOSIS — I4891 Unspecified atrial fibrillation: Secondary | ICD-10-CM | POA: Insufficient documentation

## 2024-05-16 DIAGNOSIS — Z7901 Long term (current) use of anticoagulants: Secondary | ICD-10-CM | POA: Insufficient documentation

## 2024-05-16 DIAGNOSIS — I10 Essential (primary) hypertension: Secondary | ICD-10-CM | POA: Insufficient documentation

## 2024-05-16 DIAGNOSIS — Z79899 Other long term (current) drug therapy: Secondary | ICD-10-CM | POA: Insufficient documentation

## 2024-05-16 DIAGNOSIS — R531 Weakness: Secondary | ICD-10-CM | POA: Insufficient documentation

## 2024-05-16 DIAGNOSIS — Y92512 Supermarket, store or market as the place of occurrence of the external cause: Secondary | ICD-10-CM | POA: Insufficient documentation

## 2024-05-16 DIAGNOSIS — R42 Dizziness and giddiness: Secondary | ICD-10-CM | POA: Insufficient documentation

## 2024-05-16 LAB — MAGNESIUM: Magnesium: 1.9 mg/dL (ref 1.7–2.4)

## 2024-05-16 LAB — CBC WITH DIFFERENTIAL/PLATELET
Abs Immature Granulocytes: 0.02 10*3/uL (ref 0.00–0.07)
Basophils Absolute: 0 10*3/uL (ref 0.0–0.1)
Basophils Relative: 0 %
Eosinophils Absolute: 0.1 10*3/uL (ref 0.0–0.5)
Eosinophils Relative: 1 %
HCT: 41.3 % (ref 39.0–52.0)
Hemoglobin: 13.4 g/dL (ref 13.0–17.0)
Immature Granulocytes: 0 %
Lymphocytes Relative: 18 %
Lymphs Abs: 1.7 10*3/uL (ref 0.7–4.0)
MCH: 29.6 pg (ref 26.0–34.0)
MCHC: 32.4 g/dL (ref 30.0–36.0)
MCV: 91.4 fL (ref 80.0–100.0)
Monocytes Absolute: 0.8 10*3/uL (ref 0.1–1.0)
Monocytes Relative: 8 %
Neutro Abs: 7.1 10*3/uL (ref 1.7–7.7)
Neutrophils Relative %: 73 %
Platelets: 166 10*3/uL (ref 150–400)
RBC: 4.52 MIL/uL (ref 4.22–5.81)
RDW: 13.9 % (ref 11.5–15.5)
WBC: 9.8 10*3/uL (ref 4.0–10.5)
nRBC: 0 % (ref 0.0–0.2)

## 2024-05-16 LAB — COMPREHENSIVE METABOLIC PANEL WITH GFR
ALT: 22 U/L (ref 0–44)
AST: 25 U/L (ref 15–41)
Albumin: 4.1 g/dL (ref 3.5–5.0)
Alkaline Phosphatase: 68 U/L (ref 38–126)
Anion gap: 10 (ref 5–15)
BUN: 20 mg/dL (ref 8–23)
CO2: 25 mmol/L (ref 22–32)
Calcium: 9.8 mg/dL (ref 8.9–10.3)
Chloride: 104 mmol/L (ref 98–111)
Creatinine, Ser: 1.14 mg/dL (ref 0.61–1.24)
GFR, Estimated: >60 mL/min
Glucose, Bld: 226 mg/dL — ABNORMAL HIGH (ref 70–99)
Potassium: 4.6 mmol/L (ref 3.5–5.1)
Sodium: 139 mmol/L (ref 135–145)
Total Bilirubin: 0.5 mg/dL (ref 0.0–1.2)
Total Protein: 7.2 g/dL (ref 6.5–8.1)

## 2024-05-16 NOTE — Discharge Instructions (Signed)
 Thank you for letting us  take care of you today.  You came in today because of a fall as well as some weakness after the fact.  We did blood work that was reassuring.  We do not think you have any acute injuries.  Please follow-up with your primary doctor.

## 2024-05-16 NOTE — ED Notes (Addendum)
"  Pt ambulated to RR  "

## 2024-05-16 NOTE — ED Provider Notes (Signed)
 " [place] [facility] Provider Note   CSN: [id number] Arrival date & time: 05/16/24  1857     Patient presents with: Willie Harris is a 89 y.o. male with a PMHx notable for atrial fibrillation and HTN who presents today for evaluation of weakness as well as a fall today.  Patient reports that he was at a local grocery store when getting out of his car, his cane slipped on the ice resulting him to lose his balance.  Patient reports that he was able to support himself on the vehicle however slowly sit to the ground.  Denies any heart impact, head trauma or LOC.  Has no pain since the fall.  EMS was called because this was witnessed by bystanders.  Patient reports after the fact he did have some mild lightheadedness.  Was brought in for further evaluation.  Currently without any pain, weakness, chest pain, shortness of breath, abdominal pain, nausea or vomiting.   Fall       Prior to Admission medications  Medication Sig Start Date End Date Taking? Authorizing Provider  albuterol  (PROVENTIL  HFA;VENTOLIN  HFA) 108 (90 Base) MCG/ACT inhaler Inhale 1-2 puffs into the lungs every 6 (six) hours as needed for wheezing or shortness of breath. 03/23/18   Lucila Delon BROCKS, NP  apixaban  (ELIQUIS ) 5 MG TABS tablet Take 1 tablet (5 mg total) by mouth 2 (two) times daily. 05/20/23   Jadine Toribio SQUIBB, MD  budesonide-formoterol  [facility]) 160-4.5 MCG/ACT inhaler Inhale 2 puffs into the lungs 2 (two) times daily.    [provider]  clonazePAM  (KLONOPIN ) 1 MG tablet Take 0.5 tablets (0.5 mg total) by mouth 2 (two) times daily. Patient taking differently: Take 0.5 mg by mouth in the morning and at bedtime. 02/04/14   Harris, Abigail, PA-C  Continuous Glucose Sensor (FREESTYLE LIBRE 2 SENSOR) MISC Inject 1 Device into the skin every 14 (fourteen) days. 12/10/22   [provider]  cycloSPORINE  (RESTASIS ) 0.05 % ophthalmic emulsion Apply to eye.  06/14/22   [provider]  diclofenac Sodium (VOLTAREN) 1 % GEL Apply topically. 02/02/22   [provider]  dronedarone  (MULTAQ ) 400 MG tablet Take 1 tablet (400 mg total) by mouth 2 (two) times daily with a meal. 05/20/23   Jadine Toribio SQUIBB, MD  gabapentin (NEURONTIN) 100 MG capsule Take by mouth. 02/02/22   [provider]  meclizine  (ANTIVERT ) 25 MG tablet Take 0.5 tablets (12.5 mg total) by mouth 3 (three) times daily as needed for dizziness. 12/04/20   Schuyler Charlie RAMAN, MD  melatonin 3 MG TABS tablet Take 3 mg by mouth at bedtime. Take 3 tablets (9 mg) by mouth at bedtime for Insomnia. 04/07/22   [provider]  Multiple Vitamins-Minerals (PRESERVISION AREDS 2+MULTI VIT) CAPS Take 1 capsule by mouth 2 (two) times daily. 06/14/22   [provider]  pantoprazole  (PROTONIX ) 40 MG tablet Take by mouth. 04/07/22   [provider]  pravastatin  (PRAVACHOL ) 40 MG tablet Take 40 mg by mouth daily.    [provider]  PSYLLIUM FIBER PO Take by mouth. 05/26/22   [provider]  SITagliptin 50 MG TABS Take by mouth. 10/23/22   [provider]  tadalafil (CIALIS) 20 MG tablet Take 20 mg by mouth. 03/29/22   [provider]  tamsulosin (FLOMAX) 0.4 MG CAPS capsule Take by mouth. 06/02/22   [provider]    Allergies: Doxycycline and Sulfa antibiotics  Review of Systems  Updated Vital Signs BP (!) 164/81   Pulse (!) 55   Temp 97.8 F (36.6 C) (Oral)   Resp 18   Ht 5' 11 (1.803 m)   Wt 73 kg   SpO2 97%   BMI 22.45 kg/m   Physical Exam Constitutional:      General: He is not in acute distress.    Appearance: Normal appearance.  HENT:     Head: Normocephalic and atraumatic.     Right Ear: External ear normal.     Left Ear: External ear normal.     Nose: Nose normal.     Mouth/Throat:     Mouth: Mucous membranes are moist.  Eyes:     Extraocular Movements: Extraocular movements intact.      Pupils: Pupils are equal, round, and reactive to light.  Cardiovascular:     Rate and Rhythm: Normal rate.     Pulses: Normal pulses.  Pulmonary:     Effort: Pulmonary effort is normal.     Breath sounds: Normal breath sounds.  Abdominal:     General: Abdomen is flat. There is no distension.     Palpations: Abdomen is soft.  Musculoskeletal:        General: Normal range of motion.     Cervical back: Normal range of motion. No tenderness.  Skin:    General: Skin is warm.     Capillary Refill: Capillary refill takes less than 2 seconds.  Neurological:     General: No focal deficit present.     Mental Status: He is alert and oriented to person, place, and time.     Cranial Nerves: No cranial nerve deficit.     Motor: No weakness.  Psychiatric:        Mood and Affect: Mood normal.     (all labs ordered are listed, but only abnormal results are displayed) Labs Reviewed - No data to display  EKG: None  Radiology: No results found.  Procedures   Medications Ordered in the ED - No data to display                              Medical Decision Making Amount and/or Complexity of Data Reviewed Labs: ordered.   Patient is a 89 year old male who presents today for evaluation of a fall as well as weakness.  On initial assessment patient was noted to be hemodynamically stable and afebrile.  On the bedside assessment patient was noted resting comfortably without acute distress.  No symptoms at this point in time.  Patient has no evidence of trauma or tenderness on my physical examination.  Nonfocal neurologic examination.  Given patient's age and medical comorbidities in the setting of his weakness will obtain laboratory evaluation to assess for potential infectious or metabolic derangements that may be contributing.  Given his description of events I have low concerns at this point time for trauma and I do not think he warrants imaging at this point in time.  Laboratory  evaluation without any acute abnormalities on CBC, metabolic panel or magnesium.  Patient remained clinically and hemodynamically stable during his time in the emergency department.  No recurrence of symptoms.  No ongoing or new pain.  Felt the patient was stable for discharge at this point in time.  Return precautions discussed.  Final diagnoses:  Fall, initial encounter  Weakness    ED Discharge Orders     None  Laurita Sieving, MD 05/16/24 2329  "
